# Patient Record
Sex: Female | Born: 1965 | Race: Black or African American | Hispanic: No | Marital: Married | Smoking: Never smoker
Health system: Southern US, Community
[De-identification: ages and names within clinical notes are randomized; demographics above are authoritative.]

## PROBLEM LIST (undated history)

## (undated) DIAGNOSIS — J4530 Mild persistent asthma, uncomplicated: Secondary | ICD-10-CM

## (undated) DIAGNOSIS — R0789 Other chest pain: Secondary | ICD-10-CM

## (undated) DIAGNOSIS — F419 Anxiety disorder, unspecified: Secondary | ICD-10-CM

## (undated) DIAGNOSIS — E079 Disorder of thyroid, unspecified: Secondary | ICD-10-CM

## (undated) DIAGNOSIS — E119 Type 2 diabetes mellitus without complications: Secondary | ICD-10-CM

## (undated) DIAGNOSIS — I10 Essential (primary) hypertension: Secondary | ICD-10-CM

## (undated) DIAGNOSIS — R079 Chest pain, unspecified: Secondary | ICD-10-CM

## (undated) HISTORY — PX: BREAST BIOPSY: SHX20

## (undated) HISTORY — PX: ABDOMINAL HYSTERECTOMY: SHX81

## (undated) HISTORY — PX: DILATION AND CURETTAGE OF UTERUS: SHX78

---

## 1998-04-14 ENCOUNTER — Other Ambulatory Visit: Admission: RE | Admit: 1998-04-14 | Discharge: 1998-04-14 | Payer: Self-pay | Admitting: Obstetrics and Gynecology

## 2000-10-02 ENCOUNTER — Ambulatory Visit (HOSPITAL_COMMUNITY): Admission: RE | Admit: 2000-10-02 | Discharge: 2000-10-02 | Payer: Self-pay | Admitting: Family Medicine

## 2004-03-15 ENCOUNTER — Ambulatory Visit: Payer: Self-pay | Admitting: Family Medicine

## 2004-04-08 ENCOUNTER — Ambulatory Visit: Payer: Self-pay | Admitting: Internal Medicine

## 2004-06-24 ENCOUNTER — Ambulatory Visit: Payer: Self-pay | Admitting: Family Medicine

## 2004-07-17 ENCOUNTER — Emergency Department: Payer: Self-pay | Admitting: Emergency Medicine

## 2004-08-19 ENCOUNTER — Ambulatory Visit: Payer: Self-pay | Admitting: Internal Medicine

## 2005-03-23 ENCOUNTER — Ambulatory Visit: Payer: Self-pay | Admitting: Internal Medicine

## 2005-03-25 ENCOUNTER — Ambulatory Visit: Payer: Self-pay | Admitting: Internal Medicine

## 2005-04-06 ENCOUNTER — Ambulatory Visit: Payer: Self-pay | Admitting: Hospitalist

## 2005-08-31 ENCOUNTER — Ambulatory Visit: Payer: Self-pay | Admitting: Internal Medicine

## 2015-12-31 ENCOUNTER — Emergency Department
Admission: EM | Admit: 2015-12-31 | Discharge: 2015-12-31 | Disposition: A | Discharge status: Home / Self Care | Payer: Self-pay | Attending: Emergency Medicine | Admitting: Emergency Medicine

## 2015-12-31 ENCOUNTER — Encounter: Payer: Self-pay | Admitting: Emergency Medicine

## 2015-12-31 DIAGNOSIS — Z79899 Other long term (current) drug therapy: Secondary | ICD-10-CM | POA: Insufficient documentation

## 2015-12-31 DIAGNOSIS — N390 Urinary tract infection, site not specified: Secondary | ICD-10-CM | POA: Insufficient documentation

## 2015-12-31 DIAGNOSIS — I10 Essential (primary) hypertension: Secondary | ICD-10-CM | POA: Insufficient documentation

## 2015-12-31 DIAGNOSIS — Z7982 Long term (current) use of aspirin: Secondary | ICD-10-CM | POA: Insufficient documentation

## 2015-12-31 DIAGNOSIS — E119 Type 2 diabetes mellitus without complications: Secondary | ICD-10-CM | POA: Insufficient documentation

## 2015-12-31 DIAGNOSIS — Z794 Long term (current) use of insulin: Secondary | ICD-10-CM | POA: Insufficient documentation

## 2015-12-31 DIAGNOSIS — Z7984 Long term (current) use of oral hypoglycemic drugs: Secondary | ICD-10-CM | POA: Insufficient documentation

## 2015-12-31 HISTORY — DX: Disorder of thyroid, unspecified: E07.9

## 2015-12-31 HISTORY — DX: Type 2 diabetes mellitus without complications: E11.9

## 2015-12-31 HISTORY — DX: Essential (primary) hypertension: I10

## 2015-12-31 LAB — GLUCOSE, CAPILLARY: Glucose-Capillary: 155 mg/dL — ABNORMAL HIGH (ref 65–99)

## 2015-12-31 LAB — URINALYSIS COMPLETE WITH MICROSCOPIC (ARMC ONLY)
Bilirubin Urine: NEGATIVE
Glucose, UA: NEGATIVE mg/dL
Ketones, ur: NEGATIVE mg/dL
Nitrite: POSITIVE — AB
Protein, ur: NEGATIVE mg/dL
Specific Gravity, Urine: 1.01 (ref 1.005–1.030)
pH: 6 (ref 5.0–8.0)

## 2015-12-31 MED ORDER — CEPHALEXIN 500 MG PO CAPS
500.0000 mg | ORAL_CAPSULE | Freq: Once | ORAL | Status: AC
  Administered 2015-12-31: 500 mg via ORAL

## 2015-12-31 MED ORDER — CEPHALEXIN 500 MG PO CAPS: 500.0000 mg | ORAL_CAPSULE | Freq: Three times a day (TID) | ORAL | Status: AC

## 2015-12-31 MED ORDER — CEPHALEXIN 500 MG PO CAPS
ORAL_CAPSULE | ORAL | Status: AC
  Administered 2015-12-31: 500 mg via ORAL
  Filled 2015-12-31: qty 1

## 2015-12-31 NOTE — Discharge Instructions (Signed)

## 2015-12-31 NOTE — ED Notes (Signed)
Patient presents to the ED with burning on urination, lower back pain and headache.  Patient reports drinking a lot of water today.  Patient states burning has been getting worse throughout today.  Patient is in no obvious distress at this time.

## 2015-12-31 NOTE — ED Provider Notes (Signed)
[facility] Emergency Department Provider Note   ____________________________________________  Time seen: Approximately 555 PM  I have reviewed the triage vital signs and the nursing notes.   HISTORY  Chief Complaint Dysuria; Back Pain; and Headache    HPI Angela Booth is a 50 y.o. female with a history of diabetes and hypertension who is presenting to the emergency department today with one half days of burning with urination. She said that she was having cramping back pain previously but this has abated. She also has a headache which generalized moderate which started today. She says that she has had a urinary tract infection the past which felt similar.  does not report any fever at home.   Past Medical History  Diagnosis Date  . Hypertension   . Diabetes mellitus without complication (HCC)   . Thyroid disease     There are no active problems to display for this patient.   Past Surgical History  Procedure Laterality Date  . Cesarean section      x2  . Abdominal hysterectomy    . Dilation and curettage of uterus      Current Outpatient Rx  Name  Route  Sig  Dispense  Refill  . amLODipine (NORVASC) 5 MG tablet   Oral   Take 5 mg by mouth daily.         Marland Kitchen. aspirin 81 MG tablet   Oral   Take 81 mg by mouth daily.         . enalapril (VASOTEC) 10 MG tablet   Oral   Take 20 mg by mouth daily.         . insulin detemir (LEVEMIR) 100 UNIT/ML injection   Subcutaneous   Inject 46 Units into the skin at bedtime.         . metFORMIN (GLUCOPHAGE) 500 MG tablet   Oral   Take by mouth daily with breakfast.         . thyroid (ARMOUR) 90 MG tablet   Oral   Take 90 mg by mouth daily.           Allergies Hydrocodone  No family history on file.  Social History Social History  Substance Use Topics  . Smoking status: Never Smoker   . Smokeless tobacco: None  . Alcohol Use: No    Review of Systems Constitutional: No  fever/chills Eyes: No visual changes. ENT: No sore throat. Cardiovascular: Denies chest pain. Respiratory: Denies shortness of breath. Gastrointestinal: No abdominal pain.  No nausea, no vomiting.  No diarrhea.  No constipation. Genitourinary: As above Musculoskeletal: As above Skin: Negative for rash. Neurological: Negative for headaches, focal weakness or numbness.  10-point ROS otherwise negative.  ____________________________________________   PHYSICAL EXAM:  VITAL SIGNS: ED Triage Vitals  Enc Vitals Group     BP 12/31/15 1616 157/90 mmHg     Pulse Rate 12/31/15 1616 72     Resp 12/31/15 1616 18     Temp 12/31/15 1616 98.2 F (36.8 C)     Temp Source 12/31/15 1616 Oral     SpO2 12/31/15 1616 97 %     Weight 12/31/15 1616 217 lb (98.431 kg)     Height 12/31/15 1616 5\' 2"  (1.575 m)     Head Cir --      Peak Flow --      Pain Score 12/31/15 1617 8     Pain Loc --      Pain Edu? --  Excl. in GC? --     Constitutional: Alert and oriented. Well appearing and in no acute distress. Eyes: Conjunctivae are normal. PERRL. EOMI. Head: Atraumatic. Nose: No congestion/rhinnorhea. Mouth/Throat: Mucous membranes are moist.  Neck: No stridor.   Cardiovascular: Normal rate, regular rhythm. Grossly normal heart sounds.   Respiratory: Normal respiratory effort.  No retractions. Lungs CTAB. Gastrointestinal: Soft and nontender. No distention. No CVA tenderness. Musculoskeletal: No lower extremity tenderness nor edema.  No joint effusions. Neurologic:  Normal speech and language. No gross focal neurologic deficits are appreciated.  Skin:  Skin is warm, dry and intact. No rash noted. Psychiatric: Mood and affect are normal. Speech and behavior are normal.  ____________________________________________   LABS (all labs ordered are listed, but only abnormal results are displayed)  Labs Reviewed  URINALYSIS COMPLETEWITH MICROSCOPIC ([facility] ONLY) - Abnormal; Notable for the  following:    Color, Urine AMBER (*)    APPearance CLEAR (*)    Hgb urine dipstick 2+ (*)    Nitrite POSITIVE (*)    Leukocytes, UA 1+ (*)    Bacteria, UA RARE (*)    Squamous Epithelial / LPF 0-5 (*)    All other components within normal limits  GLUCOSE, CAPILLARY - Abnormal; Notable for the following:    Glucose-Capillary 155 (*)    All other components within normal limits  URINE CULTURE  CBG MONITORING, ED   ____________________________________________  EKG   ____________________________________________  RADIOLOGY   ____________________________________________   PROCEDURES   Procedures  ____________________________________________   INITIAL IMPRESSION / ASSESSMENT AND PLAN / ED COURSE  Pertinent labs & imaging results that were available during my care of the patient were reviewed by me and considered in my medical decision making (see chart for details).  ----------------------------------------- 6:12 PM on 12/31/2015 -----------------------------------------  Patient with very reassuring exam. Denies any history of kidney stones. Has blood in her urine but this is mostly related to urinary tract infection. We'll discharge with Keflex. Also with well-controlled blood sugar which today was 155. Patient understands the plan as well as for follow-up to her primary care doctor. Will be discharged home. ____________________________________________   FINAL CLINICAL IMPRESSION(S) / ED DIAGNOSES  UTI.    NEW MEDICATIONS STARTED DURING THIS VISIT:  New Prescriptions   No medications on file     Note:  This document was prepared using Dragon voice recognition software and may include unintentional dictation errors.    Myrna Blazeravid Matthew Schaevitz, MD 12/31/15 [phone]

## 2016-01-03 LAB — URINE CULTURE

## 2016-01-20 ENCOUNTER — Ambulatory Visit: Payer: Self-pay

## 2016-02-17 ENCOUNTER — Ambulatory Visit: Payer: Self-pay

## 2016-03-02 ENCOUNTER — Encounter: Payer: Self-pay | Admitting: *Deleted

## 2016-03-02 ENCOUNTER — Ambulatory Visit
Admission: RE | Admit: 2016-03-02 | Discharge: 2016-03-02 | Disposition: A | Discharge status: Home / Self Care | Payer: Self-pay | Source: Ambulatory Visit | Attending: Oncology | Admitting: Oncology

## 2016-03-02 ENCOUNTER — Ambulatory Visit: Payer: Self-pay | Attending: Oncology | Admitting: *Deleted

## 2016-03-02 ENCOUNTER — Encounter (INDEPENDENT_AMBULATORY_CARE_PROVIDER_SITE_OTHER): Payer: Self-pay

## 2016-03-02 VITALS — BP 140/88 | HR 64 | Temp 97.9°F | Ht 63.78 in | Wt 213.6 lb

## 2016-03-02 DIAGNOSIS — Z0000 Encounter for general adult medical examination without abnormal findings: Secondary | ICD-10-CM

## 2016-03-02 NOTE — Patient Instructions (Signed)
Gave patient hand-out, Women Staying Healthy, Active and Well from [facility], with education on breast health, pap smears, heart and colon health. 

## 2016-03-02 NOTE — Progress Notes (Signed)
Subjective:     Patient ID: Angela Booth, female   DOB: 03/01/1966, 50 y.o.   MRN: [id number]  HPI   Review of Systems     Objective:   Physical Exam  Pulmonary/Chest: Right breast exhibits no inverted nipple, no mass, no nipple discharge, no skin change and no tenderness. Left breast exhibits no inverted nipple, no mass, no nipple discharge, no skin change and no tenderness. Breasts are symmetrical.  Abdominal: There is no splenomegaly or hepatomegaly.  Genitourinary: No labial fusion. There is no rash, tenderness, lesion or injury on the right labia. There is no rash, tenderness, lesion or injury on the left labia. Right adnexum displays no mass, no tenderness and no fullness. Left adnexum displays no mass, no tenderness and no fullness. No erythema, tenderness or bleeding in the vagina. No foreign body in the vagina. No signs of injury around the vagina. No vaginal discharge found.  Genitourinary Comments: History of hysterectomy for heavy bleeding       Assessment:     50 year old Black [facility].  Clinical breast exam unremarkable.  Taught self breast awareness.  Pelvic exam without masses or lesions.  No cervix identified which is consistent with a hysterectomy.  Patient has been screened for eligibility.  She does not have any insurance, Medicare or Medicaid.  She also meets financial eligibility.  Hand-out given on the Affordable Care Act.    Plan:     Bilateral screening mammogram ordered.  Will follow-up per protocol.

## 2016-03-07 ENCOUNTER — Other Ambulatory Visit: Payer: Self-pay | Admitting: *Deleted

## 2016-03-07 DIAGNOSIS — R920 Mammographic microcalcification found on diagnostic imaging of breast: Secondary | ICD-10-CM

## 2016-03-07 DIAGNOSIS — N63 Unspecified lump in unspecified breast: Secondary | ICD-10-CM

## 2016-03-22 ENCOUNTER — Ambulatory Visit
Admission: RE | Admit: 2016-03-22 | Discharge: 2016-03-22 | Disposition: A | Discharge status: Home / Self Care | Payer: Self-pay | Source: Ambulatory Visit | Attending: Oncology | Admitting: Oncology

## 2016-03-22 DIAGNOSIS — R920 Mammographic microcalcification found on diagnostic imaging of breast: Secondary | ICD-10-CM

## 2016-03-22 DIAGNOSIS — N63 Unspecified lump in unspecified breast: Secondary | ICD-10-CM

## 2016-03-23 ENCOUNTER — Other Ambulatory Visit: Payer: Self-pay | Admitting: *Deleted

## 2016-03-23 ENCOUNTER — Encounter: Payer: Self-pay | Admitting: *Deleted

## 2016-03-23 DIAGNOSIS — R920 Mammographic microcalcification found on diagnostic imaging of breast: Secondary | ICD-10-CM

## 2016-03-24 ENCOUNTER — Encounter: Payer: Self-pay | Admitting: *Deleted

## 2016-03-24 NOTE — Progress Notes (Signed)
Letter mailed to inform patient of her next mammogram in 6 months on 09/20/16 @ 10:00.  HSIS to [place].

## 2016-09-20 ENCOUNTER — Ambulatory Visit
Admission: RE | Admit: 2016-09-20 | Discharge: 2016-09-20 | Disposition: A | Discharge status: Home / Self Care | Payer: Self-pay | Source: Ambulatory Visit | Attending: Oncology | Admitting: Oncology

## 2016-09-20 DIAGNOSIS — R920 Mammographic microcalcification found on diagnostic imaging of breast: Secondary | ICD-10-CM

## 2016-09-21 ENCOUNTER — Encounter: Payer: Self-pay | Admitting: *Deleted

## 2016-09-21 NOTE — Progress Notes (Unsigned)
Letter mailed with the patients mammogram results and need to follow-up in 6 months.  Next appointment scheduled for 03/20/17 @ 8:00.  HSIS to [place].

## 2017-01-06 ENCOUNTER — Encounter: Payer: Self-pay | Admitting: Emergency Medicine

## 2017-01-06 ENCOUNTER — Emergency Department
Admission: EM | Admit: 2017-01-06 | Discharge: 2017-01-06 | Disposition: A | Discharge status: Home / Self Care | Payer: Self-pay | Attending: Emergency Medicine | Admitting: Emergency Medicine

## 2017-01-06 DIAGNOSIS — E119 Type 2 diabetes mellitus without complications: Secondary | ICD-10-CM | POA: Insufficient documentation

## 2017-01-06 DIAGNOSIS — I10 Essential (primary) hypertension: Secondary | ICD-10-CM | POA: Insufficient documentation

## 2017-01-06 DIAGNOSIS — R350 Frequency of micturition: Secondary | ICD-10-CM | POA: Insufficient documentation

## 2017-01-06 DIAGNOSIS — N3001 Acute cystitis with hematuria: Secondary | ICD-10-CM | POA: Insufficient documentation

## 2017-01-06 DIAGNOSIS — Z7984 Long term (current) use of oral hypoglycemic drugs: Secondary | ICD-10-CM | POA: Insufficient documentation

## 2017-01-06 LAB — URINALYSIS, COMPLETE (UACMP) WITH MICROSCOPIC
Bilirubin Urine: NEGATIVE
Glucose, UA: NEGATIVE mg/dL
KETONES UR: NEGATIVE mg/dL
Leukocytes, UA: NEGATIVE
Nitrite: NEGATIVE
PROTEIN: NEGATIVE mg/dL
Specific Gravity, Urine: 1.013 (ref 1.005–1.030)
pH: 5 (ref 5.0–8.0)

## 2017-01-06 MED ORDER — PHENAZOPYRIDINE HCL 100 MG PO TABS: 100.0000 mg | ORAL_TABLET | Freq: Three times a day (TID) | ORAL | 0 refills | Status: AC | PRN

## 2017-01-06 MED ORDER — CEPHALEXIN 500 MG PO CAPS: 500.0000 mg | ORAL_CAPSULE | Freq: Two times a day (BID) | ORAL | 0 refills | Status: AC

## 2017-01-06 NOTE — ED Provider Notes (Signed)
[facility] Emergency Department Provider Note  ____________________________________________  Time seen: Approximately 5:07 PM  I have reviewed the triage vital signs and the nursing notes.   HISTORY  Chief Complaint Urinary Frequency    HPI Angela Booth is a 51 y.o. female that presents to the emergency department with 1 day of dysuria and frequency of urination. She states that her last urinary tract infection was 6 months ago and this feels the same. No alleviating measures have been attempted. She denies fever, shortness breath, chest pain, nausea, vomiting, abdominal pain, back pain, hematuria, vaginal discharge.   Past Medical History:  Diagnosis Date  . Diabetes mellitus without complication (HCC)   . Hypertension   . Thyroid disease     There are no active problems to display for this patient.   Past Surgical History:  Procedure Laterality Date  . ABDOMINAL HYSTERECTOMY    . CESAREAN SECTION     x2  . DILATION AND CURETTAGE OF UTERUS      Prior to Admission medications   Medication Sig Start Date End Date Taking? Authorizing Provider  amLODipine (NORVASC) 5 MG tablet Take 5 mg by mouth daily.    [provider]  aspirin 81 MG tablet Take 81 mg by mouth daily.    [provider]  cephALEXin (KEFLEX) 500 MG capsule Take 1 capsule (500 mg total) by mouth 2 (two) times daily. 01/06/17 01/16/17  Enid Derry, PA-C  enalapril (VASOTEC) 10 MG tablet Take 20 mg by mouth daily.    [provider]  insulin detemir (LEVEMIR) 100 UNIT/ML injection Inject 46 Units into the skin at bedtime.    [provider]  metFORMIN (GLUCOPHAGE) 500 MG tablet Take by mouth daily with breakfast.    [provider]  phenazopyridine (PYRIDIUM) 100 MG tablet Take 1 tablet (100 mg total) by mouth 3 (three) times daily as needed for pain. 01/06/17 01/06/18  Enid Derry, PA-C  thyroid (ARMOUR) 90 MG tablet Take 90 mg by mouth  daily.    [provider]    Allergies Hydrocodone  Family History  Problem Relation Age of Onset  . Family history unknown: Yes    Social History Social History  Substance Use Topics  . Smoking status: Never Smoker  . Smokeless tobacco: Never Used  . Alcohol use No     Review of Systems  Constitutional: No fever/chills Cardiovascular: No chest pain. Respiratory: No SOB. Gastrointestinal: No abdominal pain.  No nausea, no vomiting.  Musculoskeletal: Negative for musculoskeletal pain. Skin: Negative for rash, abrasions, lacerations, ecchymosis. Neurological: Negative for headaches   ____________________________________________   PHYSICAL EXAM:  VITAL SIGNS: ED Triage Vitals  Enc Vitals Group     BP 01/06/17 1419 (!) 132/93     Pulse Rate 01/06/17 1419 79     Resp 01/06/17 1419 20     Temp 01/06/17 1419 97.9 F (36.6 C)     Temp Source 01/06/17 1419 Oral     SpO2 01/06/17 1419 100 %     Weight 01/06/17 1402 213 lb (96.6 kg)     Height --      Head Circumference --      Peak Flow --      Pain Score 01/06/17 1401 4     Pain Loc --      Pain Edu? --      Excl. in GC? --      Constitutional: Alert and oriented. Well appearing and in no acute  distress. Eyes: Conjunctivae are normal. PERRL. EOMI. Head: Atraumatic. ENT:      Ears:      Nose: No congestion/rhinnorhea.      Mouth/Throat: Mucous membranes are moist.  Neck: No stridor.   Cardiovascular: Normal rate, regular rhythm.  Good peripheral circulation. Respiratory: Normal respiratory effort without tachypnea or retractions. Lungs CTAB. Good air entry to the bases with no decreased or absent breath sounds. Gastrointestinal: Bowel sounds 4 quadrants. Soft and nontender to palpation. No guarding or rigidity. No palpable masses. No distention. No CVA tenderness. Musculoskeletal: Full range of motion to all extremities. No gross deformities appreciated. Neurologic:  Normal speech and language. No  gross focal neurologic deficits are appreciated.  Skin:  Skin is warm, dry and intact. No rash noted. Psychiatric: Mood and affect are normal. Speech and behavior are normal. Patient exhibits appropriate insight and judgement.   ____________________________________________   LABS (all labs ordered are listed, but only abnormal results are displayed)  Labs Reviewed  URINALYSIS, COMPLETE (UACMP) WITH MICROSCOPIC - Abnormal; Notable for the following:       Result Value   Color, Urine YELLOW (*)    APPearance CLEAR (*)    Hgb urine dipstick SMALL (*)    Bacteria, UA RARE (*)    Squamous Epithelial / LPF 0-5 (*)    All other components within normal limits  URINE CULTURE   ____________________________________________  EKG   ____________________________________________  RADIOLOGY  No results found.  ____________________________________________    PROCEDURES  Procedure(s) performed:    Procedures    Medications - No data to display   ____________________________________________   INITIAL IMPRESSION / ASSESSMENT AND PLAN / ED COURSE  Pertinent labs & imaging results that were available during my care of the patient were reviewed by me and considered in my medical decision making (see chart for details).  Review of the [place] CSRS was performed in accordance of the [facility] prior to dispensing any controlled drugs.     Patient's diagnosis is consistent with Urinary tract infection. Vital signs and exam are reassuring. Symptoms consistent with urinary tract infection. We are bacteria seen on urinalysis and this will be sent for culture. Patient will be discharged home with prescriptions for Keflex and Pyridium. Patient is to follow up with PCP as directed. Patient is given ED precautions to return to the ED for any worsening or new symptoms.     ____________________________________________  FINAL CLINICAL IMPRESSION(S) / ED DIAGNOSES  Final diagnoses:  Acute  cystitis with hematuria      NEW MEDICATIONS STARTED DURING THIS VISIT:  Discharge Medication List as of 01/06/2017  3:40 PM    START taking these medications   Details  cephALEXin (KEFLEX) 500 MG capsule Take 1 capsule (500 mg total) by mouth 2 (two) times daily., Starting Fri 01/06/2017, Until Mon 01/16/2017, Print    phenazopyridine (PYRIDIUM) 100 MG tablet Take 1 tablet (100 mg total) by mouth 3 (three) times daily as needed for pain., Starting Fri 01/06/2017, Until Sat 01/06/2018, Print            This chart was dictated using voice recognition software/Dragon. Despite best efforts to proofread, errors can occur which can change the meaning. Any change was purely unintentional.    Enid DerryWagner, Mortimer Bair, PA-C 01/06/17 2339    Nita SickleVeronese, [place], MD 01/10/17 [phone]

## 2017-01-06 NOTE — ED Triage Notes (Signed)
Pt to ed with c/o urinary frequency and burning that started last night.  

## 2017-01-08 LAB — URINE CULTURE: Culture: 40000 — AB

## 2017-03-20 ENCOUNTER — Ambulatory Visit
Admission: RE | Admit: 2017-03-20 | Discharge: 2017-03-20 | Disposition: A | Discharge status: Home / Self Care | Payer: Self-pay | Source: Ambulatory Visit | Attending: Oncology | Admitting: Oncology

## 2017-03-20 ENCOUNTER — Ambulatory Visit: Payer: Self-pay | Attending: Oncology | Admitting: *Deleted

## 2017-03-20 ENCOUNTER — Encounter: Payer: Self-pay | Admitting: *Deleted

## 2017-03-20 VITALS — BP 122/82 | Temp 98.5°F | Ht 64.0 in | Wt 224.0 lb

## 2017-03-20 DIAGNOSIS — R920 Mammographic microcalcification found on diagnostic imaging of breast: Secondary | ICD-10-CM

## 2017-03-20 NOTE — Progress Notes (Signed)
Subjective:     Patient ID: Angela Booth, female   DOB: Jul 04, 1965, 51 y.o.   MRN: [id number]  HPI   Review of Systems     Objective:   Physical Exam  Pulmonary/Chest: Right breast exhibits no inverted nipple, no mass, no nipple discharge, no skin change and no tenderness. Left breast exhibits no inverted nipple, no mass, no nipple discharge, no skin change and no tenderness. Breasts are symmetrical.    Transverse slit in bilateral nipples       Assessment:     51 year old Black female returns to [facility] for annual screening.  Last mammogram on 09/20/16 was a birads 3.  Clinical breast exam unremarkable.  Taught self breast awareness.  Patient with a history of hysterectomy for heavy bleeding.  Explained we will do a pelvic exam next year since she still has her ovaries.  She is agreeable.  Patient has been screened for eligibility.  She does not have any insurance, Medicare or Medicaid.  She also meets financial eligibility.  Hand-out given on the Affordable Care Act.    Plan:     Bilateral diagnostic mammogram and ultrasound ordered for 6 month f/u of birads 3 mammogram.  Will follow-up per [facility] protocol.

## 2017-03-20 NOTE — Patient Instructions (Signed)
Gave patient hand-out, Women Staying Healthy, Active and Well from [facility], with education on breast health, pap smears, heart and colon health. 

## 2017-03-21 ENCOUNTER — Other Ambulatory Visit: Payer: Self-pay | Admitting: *Deleted

## 2017-03-21 DIAGNOSIS — R920 Mammographic microcalcification found on diagnostic imaging of breast: Secondary | ICD-10-CM

## 2017-03-29 ENCOUNTER — Ambulatory Visit
Admission: RE | Admit: 2017-03-29 | Discharge: 2017-03-29 | Disposition: A | Discharge status: Home / Self Care | Payer: Self-pay | Source: Ambulatory Visit | Attending: Oncology | Admitting: Oncology

## 2017-03-29 DIAGNOSIS — R920 Mammographic microcalcification found on diagnostic imaging of breast: Secondary | ICD-10-CM

## 2017-03-29 HISTORY — PX: BREAST BIOPSY: SHX20

## 2017-03-30 ENCOUNTER — Encounter: Payer: Self-pay | Admitting: *Deleted

## 2017-03-30 LAB — SURGICAL PATHOLOGY

## 2017-03-30 NOTE — Progress Notes (Signed)
Letter mailed to inform patient of her next diagnostic mammogram on 03/21/18 @ 8:00.  HSIS to [place].

## 2017-11-02 ENCOUNTER — Other Ambulatory Visit: Payer: Self-pay

## 2017-11-02 ENCOUNTER — Emergency Department: Payer: Self-pay

## 2017-11-02 ENCOUNTER — Emergency Department
Admission: EM | Admit: 2017-11-02 | Discharge: 2017-11-02 | Disposition: A | Discharge status: Home / Self Care | Payer: Self-pay | Attending: Emergency Medicine | Admitting: Emergency Medicine

## 2017-11-02 ENCOUNTER — Encounter: Payer: Self-pay | Admitting: Emergency Medicine

## 2017-11-02 DIAGNOSIS — E119 Type 2 diabetes mellitus without complications: Secondary | ICD-10-CM | POA: Insufficient documentation

## 2017-11-02 DIAGNOSIS — I10 Essential (primary) hypertension: Secondary | ICD-10-CM | POA: Insufficient documentation

## 2017-11-02 DIAGNOSIS — Z79899 Other long term (current) drug therapy: Secondary | ICD-10-CM | POA: Insufficient documentation

## 2017-11-02 DIAGNOSIS — J45909 Unspecified asthma, uncomplicated: Secondary | ICD-10-CM

## 2017-11-02 DIAGNOSIS — Z794 Long term (current) use of insulin: Secondary | ICD-10-CM | POA: Insufficient documentation

## 2017-11-02 DIAGNOSIS — J45901 Unspecified asthma with (acute) exacerbation: Secondary | ICD-10-CM | POA: Insufficient documentation

## 2017-11-02 LAB — BASIC METABOLIC PANEL
Anion gap: 9 (ref 5–15)
BUN: 12 mg/dL (ref 6–20)
CALCIUM: 9.5 mg/dL (ref 8.9–10.3)
CO2: 27 mmol/L (ref 22–32)
CREATININE: 0.62 mg/dL (ref 0.44–1.00)
Chloride: 100 mmol/L — ABNORMAL LOW (ref 101–111)
GFR calc Af Amer: >60 mL/min (ref >60)
Glucose, Bld: 154 mg/dL — ABNORMAL HIGH (ref 65–99)
Potassium: 3.7 mmol/L (ref 3.5–5.1)
Sodium: 136 mmol/L (ref 135–145)

## 2017-11-02 LAB — CBC WITH DIFFERENTIAL/PLATELET
BASOS ABS: 0 10*3/uL (ref 0–0.1)
Basophils Relative: 1 %
EOS PCT: 3 %
Eosinophils Absolute: 0.2 10*3/uL (ref 0–0.7)
HCT: 34.9 % — ABNORMAL LOW (ref 35.0–47.0)
Hemoglobin: 12 g/dL (ref 12.0–16.0)
Lymphocytes Relative: 40 %
Lymphs Abs: 2.4 10*3/uL (ref 1.0–3.6)
MCH: 30.1 pg (ref 26.0–34.0)
MCHC: 34.3 g/dL (ref 32.0–36.0)
MCV: 87.9 fL (ref 80.0–100.0)
MONO ABS: 0.3 10*3/uL (ref 0.2–0.9)
Monocytes Relative: 6 %
NEUTROS ABS: 3 10*3/uL (ref 1.4–6.5)
NEUTROS PCT: 50 %
PLATELETS: 307 10*3/uL (ref 150–440)
RBC: 3.97 MIL/uL (ref 3.80–5.20)
RDW: 15.2 % — AB (ref 11.5–14.5)
WBC: 5.9 10*3/uL (ref 3.6–11.0)

## 2017-11-02 LAB — TROPONIN I: Troponin I: <0.03 ng/mL (ref <0.03)

## 2017-11-02 MED ORDER — IPRATROPIUM-ALBUTEROL 0.5-2.5 (3) MG/3ML IN SOLN: 3.0000 mL | Freq: Four times a day (QID) | RESPIRATORY_TRACT | 6 refills | Status: DC | PRN

## 2017-11-02 MED ORDER — OMEPRAZOLE 40 MG PO CPDR: 40.0000 mg | DELAYED_RELEASE_CAPSULE | Freq: Every day | ORAL | 1 refills | Status: DC

## 2017-11-02 MED ORDER — MONTELUKAST SODIUM 10 MG PO TABS
10.0000 mg | ORAL_TABLET | Freq: Every day | ORAL | 3 refills | Status: DC
Start: 2017-11-02 — End: 2018-10-19

## 2017-11-02 MED ORDER — IPRATROPIUM-ALBUTEROL 0.5-2.5 (3) MG/3ML IN SOLN
3.0000 mL | Freq: Once | RESPIRATORY_TRACT | Status: AC
  Administered 2017-11-02: 3 mL via RESPIRATORY_TRACT
  Filled 2017-11-02: qty 3

## 2017-11-02 NOTE — ED Notes (Signed)
See triage note  States she has had cough for several months  Has been seen by PCP  But has not had x-ray   No fever

## 2017-11-02 NOTE — Discharge Instructions (Signed)
Follow-up with your regular doctor in 3 to 4 days.  Asked them about testing for mold exposure.  Patient also inquired about seeing a pulmonologist.  If you are worsening please return the emergency department.  He is all medications as prescribed.

## 2017-11-02 NOTE — ED Provider Notes (Signed)
[facility] Emergency Department Provider Note  ____________________________________________   First MD Initiated Contact with Patient 11/02/17 1118     (approximate)  I have reviewed the triage vital signs and the nursing notes.   HISTORY  Chief Complaint Cough and Shortness of Breath    HPI Angela Booth is a 52 y.o. female presents emergency department complaining of a cough for several months.  She was seen by her PCP while back and stated they needed to do an x-ray.  Since they did not have the equipment in the clinic they said to come to the emergency department.  The patient states that they have had mold in the home they were living in.  They have recently moved out.  She states that she has had this cough and feels like she is wheezing in her chest is tight frequently.  She states there is been minimal improvement since leaving the environment with mold.  Past Medical History:  Diagnosis Date  . Diabetes mellitus without complication (HCC)   . Hypertension   . Thyroid disease     There are no active problems to display for this patient.   Past Surgical History:  Procedure Laterality Date  . ABDOMINAL HYSTERECTOMY    . BREAST BIOPSY Right 03/29/2017   stereo bx path pending  . CESAREAN SECTION     x2  . DILATION AND CURETTAGE OF UTERUS      Prior to Admission medications   Medication Sig Start Date End Date Taking? Authorizing Provider  amLODipine (NORVASC) 5 MG tablet Take 5 mg by mouth daily.    [provider]  aspirin 81 MG tablet Take 81 mg by mouth daily.    [provider]  enalapril (VASOTEC) 10 MG tablet Take 20 mg by mouth daily.    [provider]  insulin detemir (LEVEMIR) 100 UNIT/ML injection Inject 46 Units into the skin at bedtime.    [provider]  ipratropium-albuterol (DUONEB) 0.5-2.5 (3) MG/3ML SOLN Take 3 mLs by nebulization every 6 (six) hours as needed. 11/02/17   Nikolaj Geraghty, Roselyn Bering, PA-C  metFORMIN (GLUCOPHAGE) 500 MG tablet Take by mouth daily with breakfast.    [provider]  montelukast (SINGULAIR) 10 MG tablet Take 1 tablet (10 mg total) by mouth at bedtime. 11/02/17   Sanyiah Kanzler, Roselyn Bering, PA-C  omeprazole (PRILOSEC) 40 MG capsule Take 1 capsule (40 mg total) by mouth daily. 11/02/17   Shyenne Maggard, Roselyn Bering, PA-C  phenazopyridine (PYRIDIUM) 100 MG tablet Take 1 tablet (100 mg total) by mouth 3 (three) times daily as needed for pain. 01/06/17 01/06/18  Enid Derry, PA-C  thyroid (ARMOUR) 90 MG tablet Take 90 mg by mouth daily.    [provider]    Allergies Hydrocodone  Family History  Family history unknown: Yes    Social History Social History   Tobacco Use  . Smoking status: Never Smoker  . Smokeless tobacco: Never Used  Substance Use Topics  . Alcohol use: No  . Drug use: No    Review of Systems  Constitutional: No fever/chills Eyes: No visual changes. ENT: No sore throat. Respiratory: Positive cough and wheezing, albuterol is not helping Genitourinary: Negative for dysuria. Musculoskeletal: Negative for back pain. Skin: Negative for rash.    ____________________________________________   PHYSICAL EXAM:  VITAL SIGNS: ED Triage Vitals  Enc Vitals Group     BP 11/02/17 1018 (!) 142/81     Pulse Rate 11/02/17 1018 94  Resp 11/02/17 1018 18     Temp 11/02/17 1018 98.7 F (37.1 C)     Temp Source 11/02/17 1018 Oral     SpO2 11/02/17 1018 100 %     Weight 11/02/17 1016 224 lb (101.6 kg)     Height 11/02/17 1016  (1.575 m)     Head Circumference --      Peak Flow --      Pain Score 11/02/17 1016 0     Pain Loc --      Pain Edu? --      Excl. in GC? --     Constitutional: Alert and oriented. Well appearing and in no acute distress. Eyes: Conjunctivae are normal.  Head: Atraumatic. Nose: No congestion/rhinnorhea. Mouth/Throat: Mucous membranes are moist.   Cardiovascular: Normal rate, regular rhythm.  Heart  sounds are normal Respiratory: Normal respiratory effort.  No retractions, lungs are clear to auscultation, the patient has a dry hacking cough GU: deferred Musculoskeletal: FROM all extremities, warm and well perfused Neurologic:  Normal speech and language.  Skin:  Skin is warm, dry and intact. No rash noted. Psychiatric: Mood and affect are normal. Speech and behavior are normal.  ____________________________________________   LABS (all labs ordered are listed, but only abnormal results are displayed)  Labs Reviewed  CBC WITH DIFFERENTIAL/PLATELET - Abnormal; Notable for the following components:      Result Value   HCT 34.9 (*)    RDW 15.2 (*)    All other components within normal limits  BASIC METABOLIC PANEL - Abnormal; Notable for the following components:   Chloride 100 (*)    Glucose, Bld 154 (*)    All other components within normal limits  TROPONIN I   ____________________________________________   ____________________________________________  RADIOLOGY  Chest x-ray is negative for pneumonia or any other cardiopulmonary abnormality  ____________________________________________   PROCEDURES  Procedure(s) performed: DuoNeb  Procedures    ____________________________________________   INITIAL IMPRESSION / ASSESSMENT AND PLAN / ED COURSE  Pertinent labs & imaging results that were available during my care of the patient were reviewed by me and considered in my medical decision making (see chart for details).  Patient is 52 year old female presents emergency department complaining of a cough for several months.  She recently moved out of the house that was condemned due to mold.  She states there is been minimal improvement since she moved out of this environment.  On physical exam the patient appears well.  She does have a dry hacking cough.  Remainder the exam is benign  Due to the coughing, patient was given a DuoNeb.  After the DuoNeb the patient  states she feels much better.  All labs and x-rays were reviewed with the patient.  She is to follow-up with her regular doctor and discuss the mold exposure.  Advised her that they should request a referral to pulmonology.  She was given a prescription for Singulair 10 mg nightly, DuoNeb Nebules, nebulizer machine, and omeprazole 40 mg nightly.  She was instructed to take these medications as prescribed.  She states she understands and was discharged in stable condition.     As part of my medical decision making, I reviewed the following data within the electronic MEDICAL RECORD NUMBER Nursing notes reviewed and incorporated, Labs reviewed CBC, basic metabolic panel and troponin are all within normal limits, EKG interpreted NSR, EKG was read by the heart station Dr., Old chart reviewed, Radiograph reviewed chest x-ray is negative for any acute  abnormalities, Notes from prior ED visits and [place] Controlled Substance Database  ____________________________________________   FINAL CLINICAL IMPRESSION(S) / ED DIAGNOSES  Final diagnoses:  Acute asthmatic bronchitis      NEW MEDICATIONS STARTED DURING THIS VISIT:  New Prescriptions   IPRATROPIUM-ALBUTEROL (DUONEB) 0.5-2.5 (3) MG/3ML SOLN    Take 3 mLs by nebulization every 6 (six) hours as needed.   MONTELUKAST (SINGULAIR) 10 MG TABLET    Take 1 tablet (10 mg total) by mouth at bedtime.   OMEPRAZOLE (PRILOSEC) 40 MG CAPSULE    Take 1 capsule (40 mg total) by mouth daily.     Note:  This document was prepared using Dragon voice recognition software and may include unintentional dictation errors.    Faythe Ghee, PA-C 11/02/17 1212    Rockne Menghini, MD 11/02/17 [phone]

## 2017-11-02 NOTE — ED Triage Notes (Signed)
Has had cough since end of last year that still persists.  [facility] to [facility] and they are unable to do chest xray.  C/o SHOB and sweating at times that is intermittent for same time frame as cough.  Has tried multiple OTC meds.  unlabored currently, VSS.  Reports did find out 2 months ago had mold in home.

## 2018-03-21 ENCOUNTER — Ambulatory Visit: Payer: Self-pay | Attending: Oncology

## 2018-08-08 ENCOUNTER — Encounter: Payer: Self-pay | Admitting: *Deleted

## 2018-08-08 ENCOUNTER — Ambulatory Visit: Payer: Self-pay | Attending: Oncology | Admitting: *Deleted

## 2018-08-08 ENCOUNTER — Ambulatory Visit
Admission: RE | Admit: 2018-08-08 | Discharge: 2018-08-08 | Disposition: A | Discharge status: Home / Self Care | Payer: Self-pay | Source: Ambulatory Visit | Attending: Oncology | Admitting: Oncology

## 2018-08-08 ENCOUNTER — Encounter (INDEPENDENT_AMBULATORY_CARE_PROVIDER_SITE_OTHER): Payer: Self-pay

## 2018-08-08 VITALS — BP 121/82 | HR 79 | Temp 97.9°F | Ht 64.0 in | Wt 223.8 lb

## 2018-08-08 DIAGNOSIS — N63 Unspecified lump in unspecified breast: Secondary | ICD-10-CM

## 2018-08-08 NOTE — Progress Notes (Signed)
  Subjective:     Patient ID: Angela Booth, female   DOB: [birth year], 53 y.o.   MRN: [id number]  HPI   Review of Systems     Objective:   Physical Exam Chest:     Breasts:        Right: No swelling, bleeding, inverted nipple, mass, nipple discharge, skin change or tenderness.        Left: No swelling, bleeding, inverted nipple, mass, nipple discharge, skin change or tenderness.  Lymphadenopathy:     Upper Body:     Right upper body: No supraclavicular or axillary adenopathy.     Left upper body: No supraclavicular or axillary adenopathy.        Assessment:     53 year old Black female returns to [facility] for annual screening.  Last mammogram 03/20/17 was a birads 4 with benign biopsy of PASH.  Radiologist has recommended annual diagnostic mammogram.  On clinical breast exam there is no dominant mass, skin changes, nipple discharge or lymphadenopathy.  Taught self breast awareness.  Patient has been screened for eligibility.  She does not have any insurance, Medicare or Medicaid.  She also meets financial eligibility.  Hand-out given on the Affordable Care Act. Risk Assessment    Risk Scores      08/08/2018   Last edited by: Scarlett Presto, RN   5-year risk: 1.2 %   Lifetime risk: 8.3 %            Plan:     Bilateral diagnostic mammogram and ultrasound ordered.  Will follow-up per [facility] protocol.

## 2018-08-08 NOTE — Patient Instructions (Signed)
Gave patient hand-out, Women Staying Healthy, Active and Well from [facility], with education on breast health, pap smears, heart and colon health. 

## 2018-08-09 ENCOUNTER — Encounter: Payer: Self-pay | Admitting: *Deleted

## 2018-08-09 NOTE — Progress Notes (Signed)
Letter mailed from the [facility] to inform patient of her normal mammogram results.  Patient is to follow-up with annual screening in one year.  HSIS to Christy. 

## 2018-10-02 ENCOUNTER — Emergency Department (HOSPITAL_COMMUNITY): Payer: No Typology Code available for payment source

## 2018-10-02 ENCOUNTER — Encounter (HOSPITAL_COMMUNITY): Payer: Self-pay | Admitting: Emergency Medicine

## 2018-10-02 ENCOUNTER — Inpatient Hospital Stay (HOSPITAL_COMMUNITY)
Admission: EM | Admit: 2018-10-02 | Discharge: 2018-10-05 | DRG: 488 | Disposition: A | Discharge status: Rehabilitation Facility | Payer: No Typology Code available for payment source | Attending: Student | Admitting: Student

## 2018-10-02 DIAGNOSIS — T07XXXA Unspecified multiple injuries, initial encounter: Secondary | ICD-10-CM

## 2018-10-02 DIAGNOSIS — S42352A Displaced comminuted fracture of shaft of humerus, left arm, initial encounter for closed fracture: Secondary | ICD-10-CM | POA: Diagnosis present

## 2018-10-02 DIAGNOSIS — Z90710 Acquired absence of both cervix and uterus: Secondary | ICD-10-CM

## 2018-10-02 DIAGNOSIS — E785 Hyperlipidemia, unspecified: Secondary | ICD-10-CM | POA: Diagnosis present

## 2018-10-02 DIAGNOSIS — E46 Unspecified protein-calorie malnutrition: Secondary | ICD-10-CM | POA: Diagnosis present

## 2018-10-02 DIAGNOSIS — D62 Acute posthemorrhagic anemia: Secondary | ICD-10-CM | POA: Diagnosis present

## 2018-10-02 DIAGNOSIS — Z885 Allergy status to narcotic agent status: Secondary | ICD-10-CM

## 2018-10-02 DIAGNOSIS — S82122A Displaced fracture of lateral condyle of left tibia, initial encounter for closed fracture: Secondary | ICD-10-CM | POA: Diagnosis present

## 2018-10-02 DIAGNOSIS — Z7982 Long term (current) use of aspirin: Secondary | ICD-10-CM | POA: Diagnosis not present

## 2018-10-02 DIAGNOSIS — F419 Anxiety disorder, unspecified: Secondary | ICD-10-CM | POA: Diagnosis present

## 2018-10-02 DIAGNOSIS — R000 Tachycardia, unspecified: Secondary | ICD-10-CM | POA: Diagnosis not present

## 2018-10-02 DIAGNOSIS — Z794 Long term (current) use of insulin: Secondary | ICD-10-CM

## 2018-10-02 DIAGNOSIS — Z79899 Other long term (current) drug therapy: Secondary | ICD-10-CM | POA: Diagnosis not present

## 2018-10-02 DIAGNOSIS — G8918 Other acute postprocedural pain: Secondary | ICD-10-CM

## 2018-10-02 DIAGNOSIS — S80212A Abrasion, left knee, initial encounter: Secondary | ICD-10-CM | POA: Diagnosis present

## 2018-10-02 DIAGNOSIS — Y9301 Activity, walking, marching and hiking: Secondary | ICD-10-CM | POA: Diagnosis present

## 2018-10-02 DIAGNOSIS — J45909 Unspecified asthma, uncomplicated: Secondary | ICD-10-CM | POA: Diagnosis present

## 2018-10-02 DIAGNOSIS — X58XXXA Exposure to other specified factors, initial encounter: Secondary | ICD-10-CM | POA: Diagnosis present

## 2018-10-02 DIAGNOSIS — M23632 Other spontaneous disruption of medial collateral ligament of left knee: Secondary | ICD-10-CM

## 2018-10-02 DIAGNOSIS — Z91013 Allergy to seafood: Secondary | ICD-10-CM | POA: Diagnosis not present

## 2018-10-02 DIAGNOSIS — E1165 Type 2 diabetes mellitus with hyperglycemia: Secondary | ICD-10-CM | POA: Diagnosis present

## 2018-10-02 DIAGNOSIS — Z419 Encounter for procedure for purposes other than remedying health state, unspecified: Secondary | ICD-10-CM

## 2018-10-02 DIAGNOSIS — S42302A Unspecified fracture of shaft of humerus, left arm, initial encounter for closed fracture: Secondary | ICD-10-CM

## 2018-10-02 DIAGNOSIS — S82142A Displaced bicondylar fracture of left tibia, initial encounter for closed fracture: Principal | ICD-10-CM | POA: Diagnosis present

## 2018-10-02 DIAGNOSIS — Z6837 Body mass index (BMI) 37.0-37.9, adult: Secondary | ICD-10-CM

## 2018-10-02 DIAGNOSIS — S83262A Peripheral tear of lateral meniscus, current injury, left knee, initial encounter: Secondary | ICD-10-CM | POA: Diagnosis present

## 2018-10-02 DIAGNOSIS — I10 Essential (primary) hypertension: Secondary | ICD-10-CM | POA: Diagnosis present

## 2018-10-02 DIAGNOSIS — S0081XA Abrasion of other part of head, initial encounter: Secondary | ICD-10-CM | POA: Diagnosis present

## 2018-10-02 DIAGNOSIS — S82842D Displaced bimalleolar fracture of left lower leg, subsequent encounter for closed fracture with routine healing: Secondary | ICD-10-CM | POA: Diagnosis not present

## 2018-10-02 DIAGNOSIS — M25512 Pain in left shoulder: Secondary | ICD-10-CM | POA: Diagnosis present

## 2018-10-02 DIAGNOSIS — T148XXA Other injury of unspecified body region, initial encounter: Secondary | ICD-10-CM

## 2018-10-02 DIAGNOSIS — S82142B Displaced bicondylar fracture of left tibia, initial encounter for open fracture type I or II: Secondary | ICD-10-CM

## 2018-10-02 HISTORY — DX: Anxiety disorder, unspecified: F41.9

## 2018-10-02 HISTORY — DX: Chest pain, unspecified: R07.9

## 2018-10-02 HISTORY — DX: Other chest pain: R07.89

## 2018-10-02 HISTORY — DX: Mild persistent asthma, uncomplicated: J45.30

## 2018-10-02 LAB — COMPREHENSIVE METABOLIC PANEL
ALT: 11 U/L (ref 0–44)
AST: 21 U/L (ref 15–41)
Albumin: 3.3 g/dL — ABNORMAL LOW (ref 3.5–5.0)
Alkaline Phosphatase: 73 U/L (ref 38–126)
Anion gap: 15 (ref 5–15)
BUN: 10 mg/dL (ref 6–20)
CO2: 20 mmol/L — ABNORMAL LOW (ref 22–32)
Calcium: 9.3 mg/dL (ref 8.9–10.3)
Chloride: 101 mmol/L (ref 98–111)
Creatinine, Ser: 0.74 mg/dL (ref 0.44–1.00)
GFR calc Af Amer: >60 mL/min (ref >60)
GFR calc non Af Amer: >60 mL/min (ref >60)
Glucose, Bld: 303 mg/dL — ABNORMAL HIGH (ref 70–99)
Potassium: 3.3 mmol/L — ABNORMAL LOW (ref 3.5–5.1)
Sodium: 136 mmol/L (ref 135–145)
Total Bilirubin: 0.3 mg/dL (ref 0.3–1.2)
Total Protein: 7.7 g/dL (ref 6.5–8.1)

## 2018-10-02 LAB — CBC
HCT: 34 % — ABNORMAL LOW (ref 36.0–46.0)
Hemoglobin: 10.9 g/dL — ABNORMAL LOW (ref 12.0–15.0)
MCH: 28.6 pg (ref 26.0–34.0)
MCHC: 32.1 g/dL (ref 30.0–36.0)
MCV: 89.2 fL (ref 80.0–100.0)
Platelets: 329 10*3/uL (ref 150–400)
RBC: 3.81 MIL/uL — ABNORMAL LOW (ref 3.87–5.11)
RDW: 13.1 % (ref 11.5–15.5)
WBC: 11.2 10*3/uL — ABNORMAL HIGH (ref 4.0–10.5)
nRBC: 0 % (ref 0.0–0.2)

## 2018-10-02 LAB — LACTIC ACID, PLASMA
Lactic Acid, Venous: 2.9 mmol/L (ref 0.5–1.9)
Lactic Acid, Venous: 3 mmol/L (ref 0.5–1.9)

## 2018-10-02 LAB — SURGICAL PCR SCREEN
MRSA, PCR: NEGATIVE
Staphylococcus aureus: NEGATIVE

## 2018-10-02 LAB — HEMOGLOBIN A1C
Hgb A1c MFr Bld: 10 % — ABNORMAL HIGH (ref 4.8–5.6)
Mean Plasma Glucose: 240.3 mg/dL

## 2018-10-02 LAB — GLUCOSE, CAPILLARY
Glucose-Capillary: 330 mg/dL — ABNORMAL HIGH (ref 70–99)
Glucose-Capillary: 270 mg/dL — ABNORMAL HIGH (ref 70–99)

## 2018-10-02 LAB — SAMPLE TO BLOOD BANK

## 2018-10-02 MED ORDER — PROMETHAZINE HCL 25 MG/ML IJ SOLN
12.5000 mg | Freq: Once | INTRAMUSCULAR | Status: AC
  Administered 2018-10-02: 11:00:00 12.5 mg via INTRAVENOUS
  Filled 2018-10-02: qty 1

## 2018-10-02 MED ORDER — METHOCARBAMOL 500 MG PO TABS
500.0000 mg | ORAL_TABLET | Freq: Four times a day (QID) | ORAL | Status: DC | PRN
  Administered 2018-10-02 – 2018-10-05 (×2): 500 mg via ORAL
  Filled 2018-10-02 (×4): qty 1

## 2018-10-02 MED ORDER — POVIDONE-IODINE 10 % EX SWAB: 2.0000 "application " | Freq: Once | CUTANEOUS | Status: DC

## 2018-10-02 MED ORDER — ONDANSETRON HCL 4 MG/2ML IJ SOLN
4.0000 mg | Freq: Four times a day (QID) | INTRAMUSCULAR | Status: DC | PRN
  Administered 2018-10-02 – 2018-10-05 (×6): 4 mg via INTRAVENOUS
  Filled 2018-10-02 (×9): qty 2

## 2018-10-02 MED ORDER — METHOCARBAMOL 1000 MG/10ML IJ SOLN
500.0000 mg | Freq: Four times a day (QID) | INTRAVENOUS | Status: DC | PRN
  Filled 2018-10-02: qty 5

## 2018-10-02 MED ORDER — MORPHINE SULFATE (PF) 4 MG/ML IV SOLN
4.0000 mg | INTRAVENOUS | Status: DC | PRN
  Administered 2018-10-02: 4 mg via INTRAVENOUS
  Filled 2018-10-02: qty 1

## 2018-10-02 MED ORDER — POTASSIUM CHLORIDE IN NACL 20-0.45 MEQ/L-% IV SOLN
INTRAVENOUS | Status: DC
  Administered 2018-10-02: 17:00:00 via INTRAVENOUS
  Filled 2018-10-02: qty 1000

## 2018-10-02 MED ORDER — INSULIN ASPART 100 UNIT/ML ~~LOC~~ SOLN
0.0000 [IU] | Freq: Three times a day (TID) | SUBCUTANEOUS | Status: DC
  Administered 2018-10-02: 11 [IU] via SUBCUTANEOUS
  Administered 2018-10-03 – 2018-10-04 (×3): 7 [IU] via SUBCUTANEOUS
  Administered 2018-10-04: 11 [IU] via SUBCUTANEOUS
  Administered 2018-10-05: 3 [IU] via SUBCUTANEOUS
  Administered 2018-10-05: 09:00:00 4 [IU] via SUBCUTANEOUS
  Administered 2018-10-05: 13:00:00 7 [IU] via SUBCUTANEOUS

## 2018-10-02 MED ORDER — ONDANSETRON HCL 4 MG PO TABS
4.0000 mg | ORAL_TABLET | Freq: Four times a day (QID) | ORAL | Status: DC | PRN
  Filled 2018-10-02: qty 1

## 2018-10-02 MED ORDER — TRAMADOL HCL 50 MG PO TABS
50.0000 mg | ORAL_TABLET | Freq: Four times a day (QID) | ORAL | Status: DC | PRN
  Administered 2018-10-02: 100 mg via ORAL
  Filled 2018-10-02: qty 2

## 2018-10-02 MED ORDER — LIP MEDEX EX OINT
TOPICAL_OINTMENT | CUTANEOUS | Status: DC | PRN
  Administered 2018-10-02: 18:00:00 via TOPICAL
  Filled 2018-10-02: qty 7

## 2018-10-02 MED ORDER — CHLORHEXIDINE GLUCONATE 4 % EX LIQD
60.0000 mL | Freq: Once | CUTANEOUS | Status: AC
  Administered 2018-10-02: 4 via TOPICAL

## 2018-10-02 MED ORDER — SODIUM CHLORIDE 0.9 % IV SOLN: INTRAVENOUS | Status: DC

## 2018-10-02 MED ORDER — ENOXAPARIN SODIUM 40 MG/0.4ML ~~LOC~~ SOLN
40.0000 mg | SUBCUTANEOUS | Status: DC
  Administered 2018-10-04 – 2018-10-05 (×2): 40 mg via SUBCUTANEOUS
  Filled 2018-10-02 (×2): qty 0.4

## 2018-10-02 MED ORDER — POTASSIUM CHLORIDE IN NACL 20-0.9 MEQ/L-% IV SOLN
INTRAVENOUS | Status: DC
  Administered 2018-10-03 – 2018-10-05 (×5): via INTRAVENOUS
  Filled 2018-10-02 (×6): qty 1000

## 2018-10-02 MED ORDER — INSULIN DETEMIR 100 UNIT/ML ~~LOC~~ SOLN
20.0000 [IU] | Freq: Every day | SUBCUTANEOUS | Status: DC
  Administered 2018-10-02 – 2018-10-03 (×2): 20 [IU] via SUBCUTANEOUS
  Filled 2018-10-02 (×3): qty 0.2

## 2018-10-02 MED ORDER — IOHEXOL 300 MG/ML  SOLN
100.0000 mL | Freq: Once | INTRAMUSCULAR | Status: AC | PRN
  Administered 2018-10-02: 13:00:00 100 mL via INTRAVENOUS

## 2018-10-02 MED ORDER — CEFAZOLIN SODIUM-DEXTROSE 2-4 GM/100ML-% IV SOLN
2.0000 g | INTRAVENOUS | Status: AC
  Administered 2018-10-03: 2 g via INTRAVENOUS
  Filled 2018-10-02: qty 100

## 2018-10-02 MED ORDER — MORPHINE SULFATE (PF) 2 MG/ML IV SOLN
2.0000 mg | INTRAVENOUS | Status: DC | PRN
  Administered 2018-10-02 – 2018-10-03 (×3): 2 mg via INTRAVENOUS
  Filled 2018-10-02 (×3): qty 1

## 2018-10-02 MED ORDER — ACETAMINOPHEN 500 MG PO TABS
500.0000 mg | ORAL_TABLET | Freq: Three times a day (TID) | ORAL | Status: DC
  Administered 2018-10-02 – 2018-10-03 (×2): 500 mg via ORAL
  Filled 2018-10-02 (×2): qty 1

## 2018-10-02 MED ORDER — PROMETHAZINE HCL 25 MG/ML IJ SOLN: 12.5000 mg | Freq: Once | INTRAMUSCULAR | Status: DC

## 2018-10-02 MED ORDER — MORPHINE SULFATE (PF) 4 MG/ML IV SOLN
4.0000 mg | Freq: Once | INTRAVENOUS | Status: AC
  Administered 2018-10-02: 4 mg via INTRAVENOUS
  Filled 2018-10-02: qty 1
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
## 2018-10-02 NOTE — Plan of Care (Signed)

## 2018-10-02 NOTE — Progress Notes (Signed)
Orthopedic Tech Progress Note Patient Details:  Angela Booth 08/17/1965 [id number]  Ortho Devices Type of Ortho Device: Shoulder immobilizer Ortho Device/Splint Interventions: Ordered, Application, Adjustment   Post Interventions Instructions Provided: Adjustment of device, Care of device   Shanese Riemenschneider J Orlie Cundari 10/02/2018, 1:06 PM

## 2018-10-02 NOTE — Progress Notes (Signed)
Orthopedic Tech Progress Note Patient Details:  Angela Booth 1966-01-07 [id number]  Ortho Devices Type of Ortho Device: Ace wrap, Knee Immobilizer, Coapt Ortho Device/Splint Interventions: Application   Post Interventions Patient Tolerated: Well Instructions Provided: Care of device   Saul Fordyce 10/02/2018, 2:57 PM

## 2018-10-03 ENCOUNTER — Inpatient Hospital Stay (HOSPITAL_COMMUNITY): Payer: No Typology Code available for payment source

## 2018-10-03 ENCOUNTER — Inpatient Hospital Stay (HOSPITAL_COMMUNITY): Payer: No Typology Code available for payment source | Admitting: Registered Nurse

## 2018-10-03 ENCOUNTER — Encounter (HOSPITAL_COMMUNITY)
Admission: EM | Disposition: A | Discharge status: Rehabilitation Facility | Payer: Self-pay | Source: Home / Self Care | Attending: Student

## 2018-10-03 ENCOUNTER — Encounter (HOSPITAL_COMMUNITY): Payer: Self-pay | Admitting: Registered Nurse

## 2018-10-03 HISTORY — PX: ORIF TIBIA PLATEAU: SHX2132

## 2018-10-03 HISTORY — PX: ORIF HUMERUS FRACTURE: SHX2126

## 2018-10-03 LAB — GLUCOSE, CAPILLARY
Glucose-Capillary: 205 mg/dL — ABNORMAL HIGH (ref 70–99)
Glucose-Capillary: 222 mg/dL — ABNORMAL HIGH (ref 70–99)
Glucose-Capillary: 200 mg/dL — ABNORMAL HIGH (ref 70–99)
Glucose-Capillary: 188 mg/dL — ABNORMAL HIGH (ref 70–99)
Glucose-Capillary: 184 mg/dL — ABNORMAL HIGH (ref 70–99)
Glucose-Capillary: 316 mg/dL — ABNORMAL HIGH (ref 70–99)

## 2018-10-03 LAB — LACTIC ACID, PLASMA: Lactic Acid, Venous: 1.2 mmol/L (ref 0.5–1.9)

## 2018-10-03 LAB — CBC
HCT: 28.7 % — ABNORMAL LOW (ref 36.0–46.0)
Hemoglobin: 9.3 g/dL — ABNORMAL LOW (ref 12.0–15.0)
MCH: 28.4 pg (ref 26.0–34.0)
MCHC: 32.4 g/dL (ref 30.0–36.0)
MCV: 87.8 fL (ref 80.0–100.0)
Platelets: 248 10*3/uL (ref 150–400)
RBC: 3.27 MIL/uL — ABNORMAL LOW (ref 3.87–5.11)
RDW: 13.2 % (ref 11.5–15.5)
WBC: 8.7 10*3/uL (ref 4.0–10.5)
nRBC: 0 % (ref 0.0–0.2)

## 2018-10-03 LAB — TSH: TSH: 0.101 u[IU]/mL — ABNORMAL LOW (ref 0.350–4.500)

## 2018-10-03 LAB — PREALBUMIN: Prealbumin: 13.4 mg/dL — ABNORMAL LOW (ref 18–38)

## 2018-10-03 LAB — HIV ANTIBODY (ROUTINE TESTING W REFLEX): HIV Screen 4th Generation wRfx: NONREACTIVE

## 2018-10-03 SURGERY — OPEN REDUCTION INTERNAL FIXATION (ORIF) HUMERAL SHAFT FRACTURE
Anesthesia: General | Site: Leg Lower | Laterality: Left

## 2018-10-03 MED ORDER — VANCOMYCIN HCL 1000 MG IV SOLR
INTRAVENOUS | Status: DC | PRN
  Administered 2018-10-03: 1000 mg

## 2018-10-03 MED ORDER — ACETAMINOPHEN 10 MG/ML IV SOLN
INTRAVENOUS | Status: AC
  Filled 2018-10-03: qty 100

## 2018-10-03 MED ORDER — ROCURONIUM BROMIDE 10 MG/ML (PF) SYRINGE
PREFILLED_SYRINGE | INTRAVENOUS | Status: DC | PRN
  Administered 2018-10-03: 50 mg via INTRAVENOUS
  Administered 2018-10-03: 30 mg via INTRAVENOUS
  Administered 2018-10-03 (×2): 20 mg via INTRAVENOUS

## 2018-10-03 MED ORDER — FENTANYL CITRATE (PF) 250 MCG/5ML IJ SOLN
INTRAMUSCULAR | Status: AC
  Filled 2018-10-03: qty 5

## 2018-10-03 MED ORDER — ONDANSETRON HCL 4 MG/2ML IJ SOLN
INTRAMUSCULAR | Status: DC | PRN
  Administered 2018-10-03: 4 mg via INTRAVENOUS

## 2018-10-03 MED ORDER — PROMETHAZINE HCL 25 MG/ML IJ SOLN
INTRAMUSCULAR | Status: AC
  Filled 2018-10-03: qty 1

## 2018-10-03 MED ORDER — SODIUM CHLORIDE 0.9 % IR SOLN
Status: DC | PRN
  Administered 2018-10-03: 3000 mL

## 2018-10-03 MED ORDER — TOBRAMYCIN SULFATE 1.2 G IJ SOLR
INTRAMUSCULAR | Status: AC
  Filled 2018-10-03: qty 1.2

## 2018-10-03 MED ORDER — ROCURONIUM BROMIDE 50 MG/5ML IV SOSY
PREFILLED_SYRINGE | INTRAVENOUS | Status: AC
  Filled 2018-10-03: qty 5

## 2018-10-03 MED ORDER — ACETAMINOPHEN 325 MG PO TABS
650.0000 mg | ORAL_TABLET | Freq: Four times a day (QID) | ORAL | Status: DC
  Administered 2018-10-03 – 2018-10-05 (×8): 650 mg via ORAL
  Filled 2018-10-03 (×8): qty 2

## 2018-10-03 MED ORDER — DEXAMETHASONE SODIUM PHOSPHATE 10 MG/ML IJ SOLN
INTRAMUSCULAR | Status: DC | PRN
  Administered 2018-10-03: 10 mg via INTRAVENOUS

## 2018-10-03 MED ORDER — VANCOMYCIN HCL 1000 MG IV SOLR
INTRAVENOUS | Status: AC
  Filled 2018-10-03: qty 1000

## 2018-10-03 MED ORDER — PROPOFOL 10 MG/ML IV BOLUS
INTRAVENOUS | Status: DC | PRN
  Administered 2018-10-03: 170 mg via INTRAVENOUS
  Administered 2018-10-03: 30 mg via INTRAVENOUS

## 2018-10-03 MED ORDER — PROMETHAZINE HCL 25 MG/ML IJ SOLN
6.2500 mg | INTRAMUSCULAR | Status: DC | PRN
  Administered 2018-10-03: 6.25 mg via INTRAVENOUS

## 2018-10-03 MED ORDER — ACETAMINOPHEN 10 MG/ML IV SOLN
INTRAVENOUS | Status: DC | PRN
  Administered 2018-10-03: 1000 mg via INTRAVENOUS

## 2018-10-03 MED ORDER — PROPOFOL 10 MG/ML IV BOLUS
INTRAVENOUS | Status: AC
  Filled 2018-10-03: qty 20

## 2018-10-03 MED ORDER — SUCCINYLCHOLINE CHLORIDE 200 MG/10ML IV SOSY
PREFILLED_SYRINGE | INTRAVENOUS | Status: AC
  Filled 2018-10-03: qty 10

## 2018-10-03 MED ORDER — DEXAMETHASONE SODIUM PHOSPHATE 10 MG/ML IJ SOLN
INTRAMUSCULAR | Status: AC
  Filled 2018-10-03: qty 1

## 2018-10-03 MED ORDER — CEFAZOLIN SODIUM-DEXTROSE 2-4 GM/100ML-% IV SOLN
2.0000 g | Freq: Three times a day (TID) | INTRAVENOUS | Status: AC
  Administered 2018-10-04 (×2): 2 g via INTRAVENOUS
  Filled 2018-10-03 (×3): qty 100

## 2018-10-03 MED ORDER — HYDROMORPHONE HCL 1 MG/ML IJ SOLN
INTRAMUSCULAR | Status: DC | PRN
  Administered 2018-10-03 (×2): .25 mg via INTRAVENOUS

## 2018-10-03 MED ORDER — LACTATED RINGERS IV SOLN
INTRAVENOUS | Status: DC | PRN
  Administered 2018-10-03 (×3): via INTRAVENOUS

## 2018-10-03 MED ORDER — MIDAZOLAM HCL 5 MG/5ML IJ SOLN
INTRAMUSCULAR | Status: DC | PRN
  Administered 2018-10-03: 2 mg via INTRAVENOUS

## 2018-10-03 MED ORDER — PHENYLEPHRINE 40 MCG/ML (10ML) SYRINGE FOR IV PUSH (FOR BLOOD PRESSURE SUPPORT)
PREFILLED_SYRINGE | INTRAVENOUS | Status: DC | PRN
  Administered 2018-10-03 (×3): 80 ug via INTRAVENOUS

## 2018-10-03 MED ORDER — LACTATED RINGERS IV SOLN
INTRAVENOUS | Status: DC
  Administered 2018-10-03: 11:00:00 via INTRAVENOUS

## 2018-10-03 MED ORDER — ONDANSETRON HCL 4 MG/2ML IJ SOLN
INTRAMUSCULAR | Status: AC
  Filled 2018-10-03: qty 2

## 2018-10-03 MED ORDER — KETOROLAC TROMETHAMINE 30 MG/ML IJ SOLN
INTRAMUSCULAR | Status: AC
  Filled 2018-10-03: qty 1

## 2018-10-03 MED ORDER — LIDOCAINE 2% (20 MG/ML) 5 ML SYRINGE
INTRAMUSCULAR | Status: AC
  Filled 2018-10-03: qty 5

## 2018-10-03 MED ORDER — BACITRACIN ZINC 500 UNIT/GM EX OINT: TOPICAL_OINTMENT | Freq: Every day | CUTANEOUS | Status: DC

## 2018-10-03 MED ORDER — OXYCODONE HCL 5 MG PO TABS
5.0000 mg | ORAL_TABLET | ORAL | Status: DC | PRN
  Filled 2018-10-03: qty 2

## 2018-10-03 MED ORDER — HYDROMORPHONE HCL 1 MG/ML IJ SOLN
INTRAMUSCULAR | Status: AC
  Filled 2018-10-03: qty 1

## 2018-10-03 MED ORDER — ESMOLOL HCL 100 MG/10ML IV SOLN
INTRAVENOUS | Status: DC | PRN
  Administered 2018-10-03: 10 mg via INTRAVENOUS
  Administered 2018-10-03: 30 mg via INTRAVENOUS

## 2018-10-03 MED ORDER — LIDOCAINE 2% (20 MG/ML) 5 ML SYRINGE
INTRAMUSCULAR | Status: DC | PRN
  Administered 2018-10-03: 60 mg via INTRAVENOUS

## 2018-10-03 MED ORDER — MIDAZOLAM HCL 2 MG/2ML IJ SOLN
INTRAMUSCULAR | Status: AC
  Filled 2018-10-03: qty 2

## 2018-10-03 MED ORDER — HYDROMORPHONE HCL 1 MG/ML IJ SOLN
INTRAMUSCULAR | Status: AC
  Filled 2018-10-03: qty 0.5

## 2018-10-03 MED ORDER — HYDROMORPHONE HCL 1 MG/ML IJ SOLN
0.2500 mg | INTRAMUSCULAR | Status: DC | PRN
  Administered 2018-10-03: 0.5 mg via INTRAVENOUS

## 2018-10-03 MED ORDER — PHENYLEPHRINE 40 MCG/ML (10ML) SYRINGE FOR IV PUSH (FOR BLOOD PRESSURE SUPPORT)
PREFILLED_SYRINGE | INTRAVENOUS | Status: AC
  Filled 2018-10-03: qty 10

## 2018-10-03 MED ORDER — BACITRACIN ZINC 500 UNIT/GM EX OINT
TOPICAL_OINTMENT | CUTANEOUS | Status: AC
  Filled 2018-10-03: qty 28.35

## 2018-10-03 MED ORDER — 0.9 % SODIUM CHLORIDE (POUR BTL) OPTIME
TOPICAL | Status: DC | PRN
  Administered 2018-10-03: 1000 mL

## 2018-10-03 MED ORDER — SUCCINYLCHOLINE CHLORIDE 200 MG/10ML IV SOSY
PREFILLED_SYRINGE | INTRAVENOUS | Status: DC | PRN
  Administered 2018-10-03: 100 mg via INTRAVENOUS

## 2018-10-03 MED ORDER — GABAPENTIN 100 MG PO CAPS
100.0000 mg | ORAL_CAPSULE | Freq: Three times a day (TID) | ORAL | Status: DC
  Administered 2018-10-03 – 2018-10-05 (×6): 100 mg via ORAL
  Filled 2018-10-03 (×6): qty 1

## 2018-10-03 MED ORDER — ACETAMINOPHEN 10 MG/ML IV SOLN: 1000.0000 mg | Freq: Once | INTRAVENOUS | Status: DC | PRN

## 2018-10-03 MED ORDER — FENTANYL CITRATE (PF) 250 MCG/5ML IJ SOLN
INTRAMUSCULAR | Status: DC | PRN
  Administered 2018-10-03 (×2): 50 ug via INTRAVENOUS
  Administered 2018-10-03: 25 ug via INTRAVENOUS
  Administered 2018-10-03: 50 ug via INTRAVENOUS
  Administered 2018-10-03: 25 ug via INTRAVENOUS
  Administered 2018-10-03 (×2): 50 ug via INTRAVENOUS
  Administered 2018-10-03: 150 ug via INTRAVENOUS
  Administered 2018-10-03: 50 ug via INTRAVENOUS

## 2018-10-03 MED ORDER — KETOROLAC TROMETHAMINE 15 MG/ML IJ SOLN
INTRAMUSCULAR | Status: DC | PRN
  Administered 2018-10-03: 15 mg via INTRAVENOUS

## 2018-10-03 MED ORDER — SUGAMMADEX SODIUM 200 MG/2ML IV SOLN
INTRAVENOUS | Status: DC | PRN
  Administered 2018-10-03: 100 mg via INTRAVENOUS
  Administered 2018-10-03: 200 mg via INTRAVENOUS

## 2018-10-03 MED ORDER — TOBRAMYCIN SULFATE 1.2 G IJ SOLR
INTRAMUSCULAR | Status: DC | PRN
  Administered 2018-10-03: 1.2 g

## 2018-10-03 MED ORDER — MORPHINE SULFATE (PF) 2 MG/ML IV SOLN
2.0000 mg | INTRAVENOUS | Status: DC | PRN
  Administered 2018-10-03 – 2018-10-04 (×5): 2 mg via INTRAVENOUS
  Filled 2018-10-03 (×7): qty 1

## 2018-10-03 MED ORDER — EPHEDRINE 5 MG/ML INJ
INTRAVENOUS | Status: AC
  Filled 2018-10-03: qty 10

## 2018-10-03 SURGICAL SUPPLY — 117 items
BANDAGE ACE 3X5.8 VEL STRL LF (GAUZE/BANDAGES/DRESSINGS) ×4 IMPLANT
BANDAGE ACE 4X5 VEL STRL LF (GAUZE/BANDAGES/DRESSINGS) ×4 IMPLANT
BANDAGE ACE 6X5 VEL STRL LF (GAUZE/BANDAGES/DRESSINGS) ×4 IMPLANT
BANDAGE ELASTIC 4 VELCRO ST LF (GAUZE/BANDAGES/DRESSINGS) ×4 IMPLANT
BANDAGE ELASTIC 6 VELCRO ST LF (GAUZE/BANDAGES/DRESSINGS) ×4 IMPLANT
BANDAGE ESMARK 6X9 LF (GAUZE/BANDAGES/DRESSINGS) ×2 IMPLANT
BIT DRILL 2.5 X LONG (BIT) ×2
BIT DRILL CALIBR QC 2.8X250 (BIT) ×4 IMPLANT
BIT DRILL Q/COUPLING 1 (BIT) ×4 IMPLANT
BIT DRILL QC 2.7MM (BIT) ×2 IMPLANT
BIT DRILL QC 2X125 (BIT) ×2 IMPLANT
BIT DRILL QC 3.5X110 (BIT) ×4 IMPLANT
BIT DRILL X LONG 2.5 (BIT) ×2 IMPLANT
BLADE 15 SAFETY STRL DISP (BLADE) ×8 IMPLANT
BLADE CLIPPER SURG (BLADE) IMPLANT
BLADE SURG 15 STRL LF DISP TIS (BLADE) ×2 IMPLANT
BLADE SURG 15 STRL SS (BLADE) ×2
BNDG COHESIVE 4X5 TAN STRL (GAUZE/BANDAGES/DRESSINGS) ×4 IMPLANT
BNDG ELASTIC 2X5.8 VLCR STR LF (GAUZE/BANDAGES/DRESSINGS) ×4 IMPLANT
BNDG ELASTIC 6X15 VLCR STRL LF (GAUZE/BANDAGES/DRESSINGS) ×4 IMPLANT
BNDG ESMARK 6X9 LF (GAUZE/BANDAGES/DRESSINGS) ×4
BNDG GAUZE ELAST 4 BULKY (GAUZE/BANDAGES/DRESSINGS) ×4 IMPLANT
BRUSH SCRUB SURG 4.25 DISP (MISCELLANEOUS) ×16 IMPLANT
CANISTER SUCT 3000ML PPV (MISCELLANEOUS) ×4 IMPLANT
CANISTER WOUNDNEG PRESSURE 500 (CANNISTER) ×4 IMPLANT
CHLORAPREP W/TINT 26 (MISCELLANEOUS) ×16 IMPLANT
CHLORAPREP W/TINT 26ML (MISCELLANEOUS) IMPLANT
COVER SURGICAL LIGHT HANDLE (MISCELLANEOUS) ×8 IMPLANT
COVER WAND RF STERILE (DRAPES) ×4 IMPLANT
CUFF TOURNIQUET SINGLE 34IN LL (TOURNIQUET CUFF) ×4 IMPLANT
DERMABOND ADVANCED (GAUZE/BANDAGES/DRESSINGS) ×4
DERMABOND ADVANCED .7 DNX12 (GAUZE/BANDAGES/DRESSINGS) ×4 IMPLANT
DRAPE C-ARM 42X72 X-RAY (DRAPES) ×4 IMPLANT
DRAPE C-ARMOR (DRAPES) ×4 IMPLANT
DRAPE INCISE IOBAN 66X45 STRL (DRAPES) ×4 IMPLANT
DRAPE ORTHO SPLIT 77X108 STRL (DRAPES) ×4
DRAPE ORTHO SPLIT ENLRG (DRAPES) ×16 IMPLANT
DRAPE SURG 17X23 STRL (DRAPES) ×4 IMPLANT
DRAPE SURG ORHT 6 SPLT 77X108 (DRAPES) ×4 IMPLANT
DRAPE U-SHAPE 47X51 STRL (DRAPES) ×16 IMPLANT
DRILL BIT QC 2X125 (BIT) ×2
DRILL BIT X LONG 2.5 (BIT) ×2
DRSG ADAPTIC 3X8 NADH LF (GAUZE/BANDAGES/DRESSINGS) ×8 IMPLANT
DRSG MEPILEX BORDER 4X8 (GAUZE/BANDAGES/DRESSINGS) ×4 IMPLANT
DRSG PAD ABDOMINAL 8X10 ST (GAUZE/BANDAGES/DRESSINGS) ×8 IMPLANT
DRSG VAC ATS SM SENSATRAC (GAUZE/BANDAGES/DRESSINGS) ×4 IMPLANT
ELECT REM PT RETURN 9FT ADLT (ELECTROSURGICAL) ×4
ELECTRODE REM PT RTRN 9FT ADLT (ELECTROSURGICAL) ×2 IMPLANT
EVACUATOR 1/8 PVC DRAIN (DRAIN) IMPLANT
GAUZE SPONGE 4X4 12PLY STRL (GAUZE/BANDAGES/DRESSINGS) ×4 IMPLANT
GLOVE BIO SURGEON STRL SZ 6.5 (GLOVE) ×9 IMPLANT
GLOVE BIO SURGEON STRL SZ7.5 (GLOVE) ×16 IMPLANT
GLOVE BIO SURGEONS STRL SZ 6.5 (GLOVE) ×3
GLOVE BIOGEL PI IND STRL 6.5 (GLOVE) ×2 IMPLANT
GLOVE BIOGEL PI IND STRL 7.5 (GLOVE) ×2 IMPLANT
GLOVE BIOGEL PI INDICATOR 6.5 (GLOVE) ×2
GLOVE BIOGEL PI INDICATOR 7.5 (GLOVE) ×2
GOWN STRL REUS W/ TWL LRG LVL3 (GOWN DISPOSABLE) ×4 IMPLANT
GOWN STRL REUS W/TWL LRG LVL3 (GOWN DISPOSABLE) ×4
IMMOBILIZER KNEE 22 UNIV (SOFTGOODS) IMPLANT
KIT BASIN OR (CUSTOM PROCEDURE TRAY) ×4 IMPLANT
KIT TURNOVER KIT B (KITS) ×4 IMPLANT
MANIFOLD NEPTUNE II (INSTRUMENTS) ×4 IMPLANT
NDL SUT 6 .5 CRC .975X.05 MAYO (NEEDLE) ×2 IMPLANT
NEEDLE HYPO 25X1 1.5 SAFETY (NEEDLE) ×4 IMPLANT
NEEDLE MAYO TAPER (NEEDLE) ×2
NS IRRIG 1000ML POUR BTL (IV SOLUTION) ×4 IMPLANT
PACK ORTHO EXTREMITY (CUSTOM PROCEDURE TRAY) ×4 IMPLANT
PACK TOTAL JOINT (CUSTOM PROCEDURE TRAY) ×4 IMPLANT
PAD ARMBOARD 7.5X6 YLW CONV (MISCELLANEOUS) ×8 IMPLANT
PAD CAST 4YDX4 CTTN HI CHSV (CAST SUPPLIES) ×4 IMPLANT
PADDING CAST COTTON 4X4 STRL (CAST SUPPLIES) ×4
PADDING CAST COTTON 6X4 STRL (CAST SUPPLIES) ×4 IMPLANT
PLATE LOCKING 8 HOLE (Plate) ×4 IMPLANT
PLATE PROX TIB LT 3.5 VA-LCP (Plate) ×4 IMPLANT
PROS DRILL BIT QC 2.7MM (BIT) ×4
SCREW BN 2.5XFT 20X2.7X5X CORT (Screw) ×4 IMPLANT
SCREW CORT HEADED ST 3.5X32 (Screw) ×4 IMPLANT
SCREW CORT ST 2.7X20 (Screw) ×4 IMPLANT
SCREW CORTEX ST 4.5X22 (Screw) ×4 IMPLANT
SCREW CORTEX ST 4.5X24 (Screw) ×12 IMPLANT
SCREW CORTEX ST 4.5X26 (Screw) ×12 IMPLANT
SCREW HEADED ST 3.5X38 (Screw) ×4 IMPLANT
SCREW HEADED ST 3.5X60 (Screw) ×4 IMPLANT
SCREW HEADED ST 3.5X70 (Screw) ×4 IMPLANT
SCREW LOCKING 3.5X70MM VA (Screw) ×8 IMPLANT
SCREW VA-LOCKING 65MM 3.5 (Screw) ×4 IMPLANT
SPONGE LAP 18X18 RF (DISPOSABLE) ×4 IMPLANT
STAPLER VISISTAT 35W (STAPLE) ×4 IMPLANT
SUCTION FRAZIER HANDLE 10FR (MISCELLANEOUS) ×2
SUCTION TUBE FRAZIER 10FR DISP (MISCELLANEOUS) ×2 IMPLANT
SUT ETHILON 2 0 FS 18 (SUTURE) ×40 IMPLANT
SUT ETHILON 3 0 PS 1 (SUTURE) ×8 IMPLANT
SUT FIBERWIRE #2 38 T-5 BLUE (SUTURE) ×16
SUT FIBERWIRE 2-0 18 17.9 3/8 (SUTURE)
SUT MNCRL AB 3-0 PS2 18 (SUTURE) ×4 IMPLANT
SUT MON AB 2-0 CT1 36 (SUTURE) ×12 IMPLANT
SUT PDS AB 0 CT 36 (SUTURE) ×4 IMPLANT
SUT PROLENE 0 CT (SUTURE) IMPLANT
SUT VIC AB 0 CT1 27 (SUTURE) ×4
SUT VIC AB 0 CT1 27XBRD ANBCTR (SUTURE) ×4 IMPLANT
SUT VIC AB 1 CT1 18XCR BRD 8 (SUTURE) ×2 IMPLANT
SUT VIC AB 1 CT1 27 (SUTURE) ×2
SUT VIC AB 1 CT1 27XBRD ANBCTR (SUTURE) ×2 IMPLANT
SUT VIC AB 1 CT1 8-18 (SUTURE) ×2
SUT VIC AB 2-0 CT1 27 (SUTURE) ×4
SUT VIC AB 2-0 CT1 TAPERPNT 27 (SUTURE) ×4 IMPLANT
SUTURE FIBERWR #2 38 T-5 BLUE (SUTURE) ×8 IMPLANT
SUTURE FIBERWR 2-0 18 17.9 3/8 (SUTURE) IMPLANT
SYR CONTROL 10ML LL (SYRINGE) ×4 IMPLANT
TOWEL OR 17X24 6PK STRL BLUE (TOWEL DISPOSABLE) ×4 IMPLANT
TOWEL OR 17X26 10 PK STRL BLUE (TOWEL DISPOSABLE) ×12 IMPLANT
TRAY FOLEY CATH 14FR (SET/KITS/TRAYS/PACK) ×4 IMPLANT
TUBE CONNECTING 12'X1/4 (SUCTIONS) ×1
TUBE CONNECTING 12X1/4 (SUCTIONS) ×3 IMPLANT
WATER STERILE IRR 1000ML POUR (IV SOLUTION) ×8 IMPLANT
YANKAUER SUCT BULB TIP NO VENT (SUCTIONS) ×4 IMPLANT

## 2018-10-03 NOTE — H&P (Signed)
Please see orthopaedic consult note for full H&P 

## 2018-10-03 NOTE — Anesthesia Procedure Notes (Signed)
Procedure Name: Intubation Date/Time: 10/03/2018 12:08 PM Performed by: Zollie Scale, CRNA Pre-anesthesia Checklist: Patient identified, Emergency Drugs available, Suction available and Patient being monitored Patient Re-evaluated:Patient Re-evaluated prior to induction Oxygen Delivery Method: Circle System Utilized Preoxygenation: Pre-oxygenation with 100% oxygen Induction Type: IV induction and Rapid sequence Laryngoscope Size: Miller and 2 Grade View: Grade I Tube type: Oral Tube size: 7.0 mm Number of attempts: 1 Airway Equipment and Method: Stylet and Oral airway Placement Confirmation: ETT inserted through vocal cords under direct vision,  positive ETCO2 and breath sounds checked- equal and bilateral Secured at: 22 cm Tube secured with: Tape Dental Injury: Teeth and Oropharynx as per pre-operative assessment

## 2018-10-03 NOTE — Transfer of Care (Signed)
Immediate Anesthesia Transfer of Care Note  Patient: Angela Booth  Procedure(s) Performed: OPEN REDUCTION INTERNAL FIXATION (ORIF) HUMERAL SHAFT FRACTURE (Left Arm Lower) OPEN REDUCTION INTERNAL FIXATION (ORIF) TIBIAL PLATEAU (Left Leg Lower)  Patient Location: PACU  Anesthesia Type:General  Level of Consciousness: drowsy, patient cooperative and responds to stimulation  Airway & Oxygen Therapy: Patient Spontanous Breathing and Patient connected to face mask oxygen  Post-op Assessment: Report given to RN, Post -op Vital signs reviewed and stable, Patient moving all extremities and Patient moving all extremities X 4  Post vital signs: Reviewed and stable  Last Vitals:  Vitals Value Taken Time  BP 150/83 10/03/2018  4:27 PM  Temp    Pulse 91 10/03/2018  4:29 PM  Resp 14 10/03/2018  4:29 PM  SpO2 100 % 10/03/2018  4:29 PM  Vitals shown include unvalidated device data.  Last Pain:  Vitals:   10/03/18 1000  TempSrc: Oral  PainSc:          Complications: No apparent anesthesia complications
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
## 2018-10-03 NOTE — Progress Notes (Signed)
Doing well this am. Sore.  No change in examination LUEX or LLEX. H&H stable.  Surgery with Dr. Jena Gauss later this am.  Myrene Galas, MD [facility], [facility] [phone]

## 2018-10-03 NOTE — Progress Notes (Signed)
Reviewed case with Dr. Carola Frost. Plan for ORIF left humerus and left tibial plateau. Risks and benefits discussed with patient. Agrees to proceed with surgery.  LUE and LLE patient is neurovascularly intact. No injuries to RLE/RUE.
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
## 2018-10-04 ENCOUNTER — Encounter (HOSPITAL_COMMUNITY): Payer: Self-pay | Admitting: Student

## 2018-10-04 DIAGNOSIS — S42302A Unspecified fracture of shaft of humerus, left arm, initial encounter for closed fracture: Secondary | ICD-10-CM

## 2018-10-04 DIAGNOSIS — S83262A Peripheral tear of lateral meniscus, current injury, left knee, initial encounter: Secondary | ICD-10-CM

## 2018-10-04 DIAGNOSIS — M23632 Other spontaneous disruption of medial collateral ligament of left knee: Secondary | ICD-10-CM

## 2018-10-04 LAB — CBC
HCT: 25.6 % — ABNORMAL LOW (ref 36.0–46.0)
Hemoglobin: 8.6 g/dL — ABNORMAL LOW (ref 12.0–15.0)
MCH: 29.6 pg (ref 26.0–34.0)
MCHC: 33.6 g/dL (ref 30.0–36.0)
MCV: 88 fL (ref 80.0–100.0)
Platelets: 243 10*3/uL (ref 150–400)
RBC: 2.91 MIL/uL — ABNORMAL LOW (ref 3.87–5.11)
RDW: 13.2 % (ref 11.5–15.5)
WBC: 10.1 10*3/uL (ref 4.0–10.5)
nRBC: 0 % (ref 0.0–0.2)

## 2018-10-04 LAB — PTH, INTACT AND CALCIUM
Calcium, Total (PTH): 8.8 mg/dL (ref 8.7–10.2)
PTH: 16 pg/mL (ref 15–65)

## 2018-10-04 LAB — GLUCOSE, CAPILLARY
Glucose-Capillary: 247 mg/dL — ABNORMAL HIGH (ref 70–99)
Glucose-Capillary: 221 mg/dL — ABNORMAL HIGH (ref 70–99)
Glucose-Capillary: 253 mg/dL — ABNORMAL HIGH (ref 70–99)
Glucose-Capillary: 215 mg/dL — ABNORMAL HIGH (ref 70–99)

## 2018-10-04 MED ORDER — KETOROLAC TROMETHAMINE 15 MG/ML IJ SOLN
15.0000 mg | Freq: Four times a day (QID) | INTRAMUSCULAR | Status: AC
  Administered 2018-10-04 – 2018-10-05 (×5): 15 mg via INTRAVENOUS
  Filled 2018-10-04 (×5): qty 1

## 2018-10-04 MED ORDER — INSULIN DETEMIR 100 UNIT/ML ~~LOC~~ SOLN
20.0000 [IU] | Freq: Two times a day (BID) | SUBCUTANEOUS | Status: DC
  Administered 2018-10-04 – 2018-10-05 (×2): 20 [IU] via SUBCUTANEOUS
  Filled 2018-10-04 (×4): qty 0.2

## 2018-10-04 NOTE — Progress Notes (Signed)
Rehab Admissions Coordinator Note:  Patient was screened by Clois Dupes for appropriateness for an Inpatient Acute Rehab Consult per PT recommendation.  At this time, we are recommending Inpatient Rehab consult if patient would like to be considered for admit.   Clois Dupes RN MSN 10/04/2018, 2:15 PM  I can be reached at [phone].
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
## 2018-10-04 NOTE — Plan of Care (Signed)
  Problem: Pain Managment: Goal: General experience of comfort will improve Outcome: Progressing   Problem: Skin Integrity: Goal: Risk for impaired skin integrity will decrease Outcome: Progressing   
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
## 2018-10-04 NOTE — Progress Notes (Signed)
Orthopedic Tech Progress Note Patient Details:  Angela Booth January 12, 1966 [id number] Called in Order to [facility]  Patient ID: Angela Booth, female   DOB: May 25, 1966, 53 y.o.   MRN: [id number]   Donald Pore 10/04/2018, 9:01 AM

## 2018-10-04 NOTE — Progress Notes (Signed)
Increased patient's Insulin detemir dose to 20 unit BID per pharmacy recommendation. Her home dose is Insulin detemir 30 unit twice daily. Will continue to monitor glucose levels.    Angela Booth [name] ?[phone]? (phone)
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
## 2018-10-04 NOTE — Anesthesia Postprocedure Evaluation (Signed)
Anesthesia Post Note  Patient: Angela Booth  Procedure(s) Performed: OPEN REDUCTION INTERNAL FIXATION (ORIF) HUMERAL SHAFT FRACTURE (Left Arm Lower) OPEN REDUCTION INTERNAL FIXATION (ORIF) TIBIAL PLATEAU (Left Leg Lower)     Patient location during evaluation: PACU Anesthesia Type: General Level of consciousness: awake and alert Pain management: pain level controlled Vital Signs Assessment: post-procedure vital signs reviewed and stable Respiratory status: spontaneous breathing, nonlabored ventilation, respiratory function stable and patient connected to nasal cannula oxygen Cardiovascular status: blood pressure returned to baseline and stable Postop Assessment: no apparent nausea or vomiting Anesthetic complications: no    Last Vitals:  Vitals:   10/04/18 1400 10/04/18 1726  BP: (!) 122/97 130/73  Pulse: (!) 105 93  Resp: 20 18  Temp: 37.1 C 36.9 C  SpO2: 100% 97%    Last Pain:  Vitals:   10/04/18 1726  TempSrc: Oral  PainSc:                  Gertrue Willette P Gamaliel Charney

## 2018-10-05 ENCOUNTER — Other Ambulatory Visit: Payer: Self-pay

## 2018-10-05 ENCOUNTER — Encounter (HOSPITAL_COMMUNITY): Payer: Self-pay

## 2018-10-05 ENCOUNTER — Encounter (HOSPITAL_COMMUNITY): Payer: Self-pay | Admitting: Physical Medicine and Rehabilitation

## 2018-10-05 ENCOUNTER — Inpatient Hospital Stay (HOSPITAL_COMMUNITY)
Admission: RE | Admit: 2018-10-05 | Discharge: 2018-10-19 | DRG: 560 | Disposition: A | Discharge status: Home Health | Payer: No Typology Code available for payment source | Source: Intra-hospital | Attending: Physical Medicine & Rehabilitation | Admitting: Physical Medicine & Rehabilitation

## 2018-10-05 DIAGNOSIS — Z794 Long term (current) use of insulin: Secondary | ICD-10-CM | POA: Diagnosis not present

## 2018-10-05 DIAGNOSIS — E1165 Type 2 diabetes mellitus with hyperglycemia: Secondary | ICD-10-CM | POA: Diagnosis present

## 2018-10-05 DIAGNOSIS — E876 Hypokalemia: Secondary | ICD-10-CM | POA: Diagnosis present

## 2018-10-05 DIAGNOSIS — S83282D Other tear of lateral meniscus, current injury, left knee, subsequent encounter: Secondary | ICD-10-CM

## 2018-10-05 DIAGNOSIS — E669 Obesity, unspecified: Secondary | ICD-10-CM

## 2018-10-05 DIAGNOSIS — S82122A Displaced fracture of lateral condyle of left tibia, initial encounter for closed fracture: Secondary | ICD-10-CM

## 2018-10-05 DIAGNOSIS — I10 Essential (primary) hypertension: Secondary | ICD-10-CM | POA: Diagnosis present

## 2018-10-05 DIAGNOSIS — Z713 Dietary counseling and surveillance: Secondary | ICD-10-CM

## 2018-10-05 DIAGNOSIS — Z90710 Acquired absence of both cervix and uterus: Secondary | ICD-10-CM

## 2018-10-05 DIAGNOSIS — Z6841 Body Mass Index (BMI) 40.0 and over, adult: Secondary | ICD-10-CM | POA: Diagnosis not present

## 2018-10-05 DIAGNOSIS — S82142A Displaced bicondylar fracture of left tibia, initial encounter for closed fracture: Secondary | ICD-10-CM

## 2018-10-05 DIAGNOSIS — Z79890 Hormone replacement therapy (postmenopausal): Secondary | ICD-10-CM

## 2018-10-05 DIAGNOSIS — Z79899 Other long term (current) drug therapy: Secondary | ICD-10-CM | POA: Diagnosis not present

## 2018-10-05 DIAGNOSIS — D62 Acute posthemorrhagic anemia: Secondary | ICD-10-CM

## 2018-10-05 DIAGNOSIS — S42302D Unspecified fracture of shaft of humerus, left arm, subsequent encounter for fracture with routine healing: Secondary | ICD-10-CM

## 2018-10-05 DIAGNOSIS — R7309 Other abnormal glucose: Secondary | ICD-10-CM

## 2018-10-05 DIAGNOSIS — S82842D Displaced bimalleolar fracture of left lower leg, subsequent encounter for closed fracture with routine healing: Principal | ICD-10-CM

## 2018-10-05 DIAGNOSIS — Z79891 Long term (current) use of opiate analgesic: Secondary | ICD-10-CM

## 2018-10-05 DIAGNOSIS — S42302A Unspecified fracture of shaft of humerus, left arm, initial encounter for closed fracture: Secondary | ICD-10-CM | POA: Diagnosis present

## 2018-10-05 DIAGNOSIS — E1169 Type 2 diabetes mellitus with other specified complication: Secondary | ICD-10-CM

## 2018-10-05 DIAGNOSIS — T148XXA Other injury of unspecified body region, initial encounter: Secondary | ICD-10-CM

## 2018-10-05 DIAGNOSIS — G8918 Other acute postprocedural pain: Secondary | ICD-10-CM

## 2018-10-05 DIAGNOSIS — T1490XA Injury, unspecified, initial encounter: Secondary | ICD-10-CM | POA: Diagnosis present

## 2018-10-05 DIAGNOSIS — F419 Anxiety disorder, unspecified: Secondary | ICD-10-CM | POA: Diagnosis present

## 2018-10-05 DIAGNOSIS — S42352A Displaced comminuted fracture of shaft of humerus, left arm, initial encounter for closed fracture: Secondary | ICD-10-CM

## 2018-10-05 DIAGNOSIS — T07XXXA Unspecified multiple injuries, initial encounter: Secondary | ICD-10-CM

## 2018-10-05 DIAGNOSIS — S82122D Displaced fracture of lateral condyle of left tibia, subsequent encounter for closed fracture with routine healing: Secondary | ICD-10-CM

## 2018-10-05 LAB — BASIC METABOLIC PANEL
Anion gap: 10 (ref 5–15)
BUN: <5 mg/dL — ABNORMAL LOW (ref 6–20)
CO2: 26 mmol/L (ref 22–32)
Calcium: 8.6 mg/dL — ABNORMAL LOW (ref 8.9–10.3)
Chloride: 102 mmol/L (ref 98–111)
Creatinine, Ser: 0.76 mg/dL (ref 0.44–1.00)
GFR calc Af Amer: >60 mL/min (ref >60)
GFR calc non Af Amer: >60 mL/min (ref >60)
Glucose, Bld: 250 mg/dL — ABNORMAL HIGH (ref 70–99)
Potassium: 3.5 mmol/L (ref 3.5–5.1)
Sodium: 138 mmol/L (ref 135–145)

## 2018-10-05 LAB — CBC
HCT: 26.5 % — ABNORMAL LOW (ref 36.0–46.0)
Hemoglobin: 8.4 g/dL — ABNORMAL LOW (ref 12.0–15.0)
MCH: 28.2 pg (ref 26.0–34.0)
MCHC: 31.7 g/dL (ref 30.0–36.0)
MCV: 88.9 fL (ref 80.0–100.0)
Platelets: 268 10*3/uL (ref 150–400)
RBC: 2.98 MIL/uL — ABNORMAL LOW (ref 3.87–5.11)
RDW: 13.4 % (ref 11.5–15.5)
WBC: 9.5 10*3/uL (ref 4.0–10.5)
nRBC: 0.3 % — ABNORMAL HIGH (ref 0.0–0.2)

## 2018-10-05 LAB — GLUCOSE, CAPILLARY
Glucose-Capillary: 154 mg/dL — ABNORMAL HIGH (ref 70–99)
Glucose-Capillary: 228 mg/dL — ABNORMAL HIGH (ref 70–99)
Glucose-Capillary: 136 mg/dL — ABNORMAL HIGH (ref 70–99)
Glucose-Capillary: 210 mg/dL — ABNORMAL HIGH (ref 70–99)

## 2018-10-05 MED ORDER — MORPHINE SULFATE 15 MG PO TABS
15.0000 mg | ORAL_TABLET | ORAL | Status: DC | PRN
  Administered 2018-10-05 – 2018-10-12 (×11): 15 mg via ORAL
  Filled 2018-10-05 (×11): qty 1

## 2018-10-05 MED ORDER — THYROID 120 MG PO TABS
120.0000 mg | ORAL_TABLET | Freq: Every day | ORAL | Status: DC
  Administered 2018-10-06 – 2018-10-19 (×14): 120 mg via ORAL
  Filled 2018-10-05 (×15): qty 1

## 2018-10-05 MED ORDER — MONTELUKAST SODIUM 10 MG PO TABS
10.0000 mg | ORAL_TABLET | Freq: Every day | ORAL | Status: DC
  Administered 2018-10-05 – 2018-10-18 (×14): 10 mg via ORAL
  Filled 2018-10-05 (×14): qty 1

## 2018-10-05 MED ORDER — BISACODYL 10 MG RE SUPP
10.0000 mg | Freq: Every day | RECTAL | Status: DC | PRN
  Administered 2018-10-06: 10 mg via RECTAL
  Filled 2018-10-05: qty 1

## 2018-10-05 MED ORDER — PROCHLORPERAZINE EDISYLATE 10 MG/2ML IJ SOLN: 5.0000 mg | Freq: Four times a day (QID) | INTRAMUSCULAR | Status: DC | PRN

## 2018-10-05 MED ORDER — ACETAMINOPHEN 325 MG PO TABS
650.0000 mg | ORAL_TABLET | Freq: Four times a day (QID) | ORAL | Status: DC
  Administered 2018-10-06 – 2018-10-19 (×51): 650 mg via ORAL
  Filled 2018-10-05 (×53): qty 2

## 2018-10-05 MED ORDER — METHOCARBAMOL 500 MG PO TABS
500.0000 mg | ORAL_TABLET | Freq: Four times a day (QID) | ORAL | Status: DC | PRN
  Administered 2018-10-05 – 2018-10-11 (×2): 500 mg via ORAL
  Filled 2018-10-05 (×4): qty 1

## 2018-10-05 MED ORDER — ACETAMINOPHEN 325 MG PO TABS
325.0000 mg | ORAL_TABLET | ORAL | Status: DC | PRN
  Administered 2018-10-07 – 2018-10-18 (×2): 650 mg via ORAL
  Filled 2018-10-05: qty 2

## 2018-10-05 MED ORDER — VITAMINS A & D EX OINT
TOPICAL_OINTMENT | Freq: Two times a day (BID) | CUTANEOUS | Status: DC
  Administered 2018-10-05 – 2018-10-15 (×20): via TOPICAL
  Administered 2018-10-15: 1 via TOPICAL
  Administered 2018-10-16 – 2018-10-19 (×7): via TOPICAL
  Filled 2018-10-05: qty 56.7

## 2018-10-05 MED ORDER — TRAZODONE HCL 50 MG PO TABS
25.0000 mg | ORAL_TABLET | Freq: Every evening | ORAL | Status: DC | PRN
  Administered 2018-10-08 – 2018-10-10 (×3): 25 mg via ORAL
  Filled 2018-10-05 (×3): qty 1

## 2018-10-05 MED ORDER — INSULIN ASPART 100 UNIT/ML ~~LOC~~ SOLN
0.0000 [IU] | Freq: Every day | SUBCUTANEOUS | Status: DC
  Administered 2018-10-05: 23:00:00 2 [IU] via SUBCUTANEOUS
  Administered 2018-10-06: 23:00:00 3 [IU] via SUBCUTANEOUS
  Administered 2018-10-07: 22:00:00 2 [IU] via SUBCUTANEOUS

## 2018-10-05 MED ORDER — LIP MEDEX EX OINT
TOPICAL_OINTMENT | CUTANEOUS | Status: DC | PRN
  Administered 2018-10-05: 21:00:00 via TOPICAL
  Filled 2018-10-05: qty 7

## 2018-10-05 MED ORDER — PRO-STAT SUGAR FREE PO LIQD
30.0000 mL | Freq: Two times a day (BID) | ORAL | Status: DC
  Administered 2018-10-05 – 2018-10-19 (×28): 30 mL via ORAL
  Filled 2018-10-05 (×28): qty 30

## 2018-10-05 MED ORDER — PROCHLORPERAZINE 25 MG RE SUPP: 12.5000 mg | Freq: Four times a day (QID) | RECTAL | Status: DC | PRN

## 2018-10-05 MED ORDER — ALUM & MAG HYDROXIDE-SIMETH 200-200-20 MG/5ML PO SUSP
30.0000 mL | ORAL | Status: DC | PRN
  Administered 2018-10-09 – 2018-10-10 (×2): 30 mL via ORAL
  Filled 2018-10-05 (×2): qty 30

## 2018-10-05 MED ORDER — GABAPENTIN 100 MG PO CAPS
100.0000 mg | ORAL_CAPSULE | Freq: Three times a day (TID) | ORAL | Status: DC
  Administered 2018-10-05 – 2018-10-19 (×40): 100 mg via ORAL
  Filled 2018-10-05 (×40): qty 1

## 2018-10-05 MED ORDER — GUAIFENESIN-DM 100-10 MG/5ML PO SYRP: 5.0000 mL | ORAL_SOLUTION | Freq: Four times a day (QID) | ORAL | Status: DC | PRN

## 2018-10-05 MED ORDER — FLEET ENEMA 7-19 GM/118ML RE ENEM: 1.0000 | ENEMA | Freq: Once | RECTAL | Status: DC | PRN

## 2018-10-05 MED ORDER — ENOXAPARIN SODIUM 40 MG/0.4ML ~~LOC~~ SOLN
40.0000 mg | SUBCUTANEOUS | Status: DC
  Administered 2018-10-06 – 2018-10-19 (×14): 40 mg via SUBCUTANEOUS
  Filled 2018-10-05 (×14): qty 0.4

## 2018-10-05 MED ORDER — INSULIN DETEMIR 100 UNIT/ML ~~LOC~~ SOLN
20.0000 [IU] | Freq: Two times a day (BID) | SUBCUTANEOUS | Status: DC
  Administered 2018-10-05 – 2018-10-08 (×6): 20 [IU] via SUBCUTANEOUS
  Filled 2018-10-05 (×8): qty 0.2

## 2018-10-05 MED ORDER — TRAMADOL HCL 50 MG PO TABS
50.0000 mg | ORAL_TABLET | Freq: Four times a day (QID) | ORAL | Status: DC | PRN
  Administered 2018-10-05: 50 mg via ORAL
  Filled 2018-10-05: qty 1

## 2018-10-05 MED ORDER — INSULIN ASPART 100 UNIT/ML ~~LOC~~ SOLN
0.0000 [IU] | Freq: Three times a day (TID) | SUBCUTANEOUS | Status: DC
  Administered 2018-10-06: 3 [IU] via SUBCUTANEOUS
  Administered 2018-10-06: 1 [IU] via SUBCUTANEOUS
  Administered 2018-10-06: 08:00:00 3 [IU] via SUBCUTANEOUS
  Administered 2018-10-07: 09:00:00 2 [IU] via SUBCUTANEOUS
  Administered 2018-10-07 (×2): 3 [IU] via SUBCUTANEOUS
  Administered 2018-10-08: 08:00:00 2 [IU] via SUBCUTANEOUS

## 2018-10-05 MED ORDER — PROCHLORPERAZINE MALEATE 5 MG PO TABS
5.0000 mg | ORAL_TABLET | Freq: Four times a day (QID) | ORAL | Status: DC | PRN
  Administered 2018-10-11: 10 mg via ORAL
  Filled 2018-10-05: qty 2

## 2018-10-05 MED ORDER — TRAMADOL HCL 50 MG PO TABS
50.0000 mg | ORAL_TABLET | Freq: Four times a day (QID) | ORAL | Status: DC | PRN
  Administered 2018-10-06 – 2018-10-07 (×4): 100 mg via ORAL
  Administered 2018-10-08: 22:00:00 50 mg via ORAL
  Administered 2018-10-08 – 2018-10-09 (×3): 100 mg via ORAL
  Administered 2018-10-10: 50 mg via ORAL
  Administered 2018-10-10 – 2018-10-11 (×2): 100 mg via ORAL
  Filled 2018-10-05: qty 1
  Filled 2018-10-05 (×9): qty 2

## 2018-10-05 MED ORDER — POLYSACCHARIDE IRON COMPLEX 150 MG PO CAPS
150.0000 mg | ORAL_CAPSULE | Freq: Every day | ORAL | Status: DC
  Administered 2018-10-06 – 2018-10-19 (×14): 150 mg via ORAL
  Filled 2018-10-05 (×14): qty 1

## 2018-10-05 MED ORDER — POLYETHYLENE GLYCOL 3350 17 G PO PACK
17.0000 g | PACK | Freq: Every day | ORAL | Status: DC
  Administered 2018-10-05 – 2018-10-15 (×9): 17 g via ORAL
  Filled 2018-10-05 (×13): qty 1

## 2018-10-05 MED ORDER — DIPHENHYDRAMINE HCL 12.5 MG/5ML PO ELIX
25.0000 mg | ORAL_SOLUTION | Freq: Four times a day (QID) | ORAL | Status: DC | PRN
  Administered 2018-10-05 – 2018-10-18 (×14): 25 mg via ORAL
  Filled 2018-10-05 (×14): qty 10
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
## 2018-10-05 NOTE — Progress Notes (Signed)
Patient transferred/admitted to 4M05 accompanied by RN via hospital bed. Pt alert and oriented; complained of 8/10 aching/sharp pain on L leg/incision. Oriented to room and policies. Needs attended.

## 2018-10-05 NOTE — Progress Notes (Addendum)
Inpatient Rehab Admissions:  Inpatient Rehab Consult received.  I met with patient at the bedside for rehabilitation assessment and to discuss goals and expectations of an inpatient rehab admission.  She is very interested in an inpatient rehab admission and has good family support for discharge.  I will contact her husband to confirm 24/7 support and await medical readiness for possible rehab admission tomorrow or Monday.   Addendum: I have spoken with rehab MD and ortho PA and pt is medically ready for admission to rehab today.  I will alert pt, family, RN, and CM.   Signed: Shann Medal, PT, DPT Admissions Coordinator [phone] 10/05/18  12:24 PM
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
## 2018-10-06 ENCOUNTER — Inpatient Hospital Stay (HOSPITAL_COMMUNITY): Payer: Self-pay | Admitting: Occupational Therapy

## 2018-10-06 ENCOUNTER — Inpatient Hospital Stay (HOSPITAL_COMMUNITY): Payer: Self-pay | Admitting: Physical Therapy

## 2018-10-06 DIAGNOSIS — D62 Acute posthemorrhagic anemia: Secondary | ICD-10-CM

## 2018-10-06 DIAGNOSIS — E1165 Type 2 diabetes mellitus with hyperglycemia: Secondary | ICD-10-CM

## 2018-10-06 DIAGNOSIS — M79605 Pain in left leg: Secondary | ICD-10-CM

## 2018-10-06 DIAGNOSIS — T1490XA Injury, unspecified, initial encounter: Secondary | ICD-10-CM

## 2018-10-06 LAB — COMPREHENSIVE METABOLIC PANEL
ALT: 14 U/L (ref 0–44)
AST: 19 U/L (ref 15–41)
Albumin: 2.8 g/dL — ABNORMAL LOW (ref 3.5–5.0)
Alkaline Phosphatase: 55 U/L (ref 38–126)
Anion gap: 11 (ref 5–15)
BUN: 10 mg/dL (ref 6–20)
CO2: 28 mmol/L (ref 22–32)
Calcium: 8.9 mg/dL (ref 8.9–10.3)
Chloride: 100 mmol/L (ref 98–111)
Creatinine, Ser: 0.94 mg/dL (ref 0.44–1.00)
GFR calc Af Amer: >60 mL/min (ref >60)
GFR calc non Af Amer: >60 mL/min (ref >60)
Glucose, Bld: 156 mg/dL — ABNORMAL HIGH (ref 70–99)
Potassium: 3.4 mmol/L — ABNORMAL LOW (ref 3.5–5.1)
Sodium: 139 mmol/L (ref 135–145)
Total Bilirubin: 0.5 mg/dL (ref 0.3–1.2)
Total Protein: 6.5 g/dL (ref 6.5–8.1)

## 2018-10-06 LAB — CBC WITH DIFFERENTIAL/PLATELET
Abs Immature Granulocytes: 0.03 10*3/uL (ref 0.00–0.07)
Basophils Absolute: 0 10*3/uL (ref 0.0–0.1)
Basophils Relative: 0 %
Eosinophils Absolute: 0.2 10*3/uL (ref 0.0–0.5)
Eosinophils Relative: 3 %
HCT: 26.7 % — ABNORMAL LOW (ref 36.0–46.0)
Hemoglobin: 8.6 g/dL — ABNORMAL LOW (ref 12.0–15.0)
Immature Granulocytes: 0 %
Lymphocytes Relative: 37 %
Lymphs Abs: 3 10*3/uL (ref 0.7–4.0)
MCH: 28.9 pg (ref 26.0–34.0)
MCHC: 32.2 g/dL (ref 30.0–36.0)
MCV: 89.6 fL (ref 80.0–100.0)
Monocytes Absolute: 0.5 10*3/uL (ref 0.1–1.0)
Monocytes Relative: 6 %
Neutro Abs: 4.4 10*3/uL (ref 1.7–7.7)
Neutrophils Relative %: 54 %
Platelets: 287 10*3/uL (ref 150–400)
RBC: 2.98 MIL/uL — ABNORMAL LOW (ref 3.87–5.11)
RDW: 13.3 % (ref 11.5–15.5)
WBC: 8.1 10*3/uL (ref 4.0–10.5)
nRBC: 0.6 % — ABNORMAL HIGH (ref 0.0–0.2)

## 2018-10-06 LAB — GLUCOSE, CAPILLARY
Glucose-Capillary: 201 mg/dL — ABNORMAL HIGH (ref 70–99)
Glucose-Capillary: 212 mg/dL — ABNORMAL HIGH (ref 70–99)
Glucose-Capillary: 143 mg/dL — ABNORMAL HIGH (ref 70–99)
Glucose-Capillary: 264 mg/dL — ABNORMAL HIGH (ref 70–99)

## 2018-10-06 LAB — VITAMIN D 25 HYDROXY (VIT D DEFICIENCY, FRACTURES): Vit D, 25-Hydroxy: 14.3 ng/mL — ABNORMAL LOW (ref 30.0–100.0)
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
## 2018-10-06 NOTE — Progress Notes (Signed)
Physical Therapy Session Note  Patient Details  Name: Angela Booth MRN: [id number] Date of Birth: 06-26-1966  Today's Date: 10/06/2018 PT Individual Time: 1100-1155 PT Individual Time Calculation (min): 55 min   Short Term Goals: Week 1:   See PT Eval  Skilled Therapeutic Interventions/Progress Updates:    Pt received seated in w/c in room, agreeable to PT session. Pt reports pain in LLE that increases with activity, not rated but requests pain medication from RN at end of therapy session. Set pt up with LUE platform RW to attempt standing. Sit to stand with max A to PFRW x 2 reps. Pt tolerates standing about 30 sec before needing to sit due to increase in LLE pain in standing. Focus on weight-shifting onto RLE to adhere to NWBing on LLE. Slide board transfer w/c to bed with mod A to the R. Scooting towards [facility] with mod A. Sit to supine min A for LLE management. Pt left semi-reclined in bed with needs in reach, bed alarm in place.  Therapy Documentation Precautions:  Restrictions Weight Bearing Restrictions: Yes LUE Weight Bearing: Weight bearing as tolerated LLE Weight Bearing: Non weight bearing Other Position/Activity Restrictions: hinged knee brace locked in ext at night can be unlocked during day for ROM   Therapy/Group: Individual Therapy  Peter Congo, PT, DPT  10/06/2018, 12:17 PM

## 2018-10-06 NOTE — Progress Notes (Signed)
Nutrition Brief Note  RD consulted for diet education regarding DM2 vs Obesity.   RD operating remotely d/t COVID precautions.   RD was able to reach pt by phone, however she sounded drowsy/lethargic. RD asked if this was a good time and she said it was not as she had just received prn pain medication. She was agreeable to RD following up early next week when appropriate.   Will leave c/s in place for now.   Christophe Louis RD, LDN, CNSC Clinical Nutrition Available Tues-Sat via Pager: [phone] 10/06/2018 7:50 PM

## 2018-10-06 NOTE — Progress Notes (Signed)
Angela Booth is a 53 y.o. female 1966/03/25 [id number]  Subjective: No new complaints, except for left leg pain. No new problems. Slept well. Feeling OK.  Objective: Vital signs in last 24 hours: Temp:  [97.9 F (36.6 C)-98.4 F (36.9 C)] 97.9 F (36.6 C) (04/11 0641) Pulse Rate:  [82-91] 88 (04/11 0641) Resp:  [16-18] 18 (04/11 0641) BP: (102-136)/(60-71) 102/60 (04/11 0641) SpO2:  [96 %-99 %] 98 % (04/11 0641) Weight:  [101.3 kg] 101.3 kg (04/10 2213) Weight change:  Last BM Date: 10/06/18  Intake/Output from previous day: 04/10 0701 - 04/11 0700 In: 240 [P.O.:240] Out: -  Last cbgs: CBG (last 3)  Recent Labs    10/05/18 2206 10/06/18 0744 10/06/18 1143  GLUCAP 210* 201* 212*     Physical Exam General: No apparent distress.  Obese HEENT: not dry Lungs: Normal effort. Lungs clear to auscultation, no crackles or wheezes. Cardiovascular: Regular rate and rhythm, no edema Abdomen: S/NT/ND; BS(+) Musculoskeletal:  unchanged Neurological: No new neurological deficits Wounds: Clean    Skin: clear Mental state: Alert, oriented, cooperative    Lab Results: BMET    Component Value Date/Time   NA 139 10/06/2018 0558   K 3.4 (L) 10/06/2018 0558   CL 100 10/06/2018 0558   CO2 28 10/06/2018 0558   GLUCOSE 156 (H) 10/06/2018 0558   BUN 10 10/06/2018 0558   CREATININE 0.94 10/06/2018 0558   CALCIUM 8.9 10/06/2018 0558   CALCIUM 8.8 10/03/2018 0419   GFRNONAA >60 10/06/2018 0558   GFRAA >60 10/06/2018 0558   CBC    Component Value Date/Time   WBC 8.1 10/06/2018 0558   RBC 2.98 (L) 10/06/2018 0558   HGB 8.6 (L) 10/06/2018 0558   HCT 26.7 (L) 10/06/2018 0558   PLT 287 10/06/2018 0558   MCV 89.6 10/06/2018 0558   MCH 28.9 10/06/2018 0558   MCHC 32.2 10/06/2018 0558   RDW 13.3 10/06/2018 0558   LYMPHSABS 3.0 10/06/2018 0558   MONOABS 0.5 10/06/2018 0558   EOSABS 0.2 10/06/2018 0558   BASOSABS 0.0 10/06/2018 0558    Studies/Results: No results  found.  Medications: I have reviewed the patient's current medications.  Assessment/Plan:   1.  Status post multi Ortho surgeries.  Deficits with mobility, transfers, endurance, self-care.  CIR 2.  DVT prophylaxis with Lovenox 3.  Pain management with the oral morphine as needed 4.  Emotional support 5.  Skin care/wound care 6.  Acute blood loss anemia.  Iron supplement added.  CBC 7.  Type 2 diabetes with elevated hemoglobin A1c of 10.0%.  Increase Levemir to 25 units twice daily.  Sliding scale insulin 8.  Morbid obesity.  Dietary consultation     Length of stay, days: 1  Sonda Primes , MD 10/06/2018, 12:24 PM
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
## 2018-10-07 LAB — GLUCOSE, CAPILLARY
Glucose-Capillary: 166 mg/dL — ABNORMAL HIGH (ref 70–99)
Glucose-Capillary: 217 mg/dL — ABNORMAL HIGH (ref 70–99)
Glucose-Capillary: 217 mg/dL — ABNORMAL HIGH (ref 70–99)
Glucose-Capillary: 233 mg/dL — ABNORMAL HIGH (ref 70–99)

## 2018-10-07 NOTE — Progress Notes (Signed)
Angela Booth is a 53 y.o. female 10-Jun-1966 [id number]  Subjective: Tired from yesterday.  Resting. No new problems. Slept well. Feeling OK.  Objective: Vital signs in last 24 hours: Temp:  [97.4 F (36.3 C)-97.8 F (36.6 C)] 97.7 F (36.5 C) (04/12 0445) Pulse Rate:  [79-92] 79 (04/12 0445) Resp:  [18-19] 18 (04/12 0445) BP: (116-119)/(57-70) 119/70 (04/12 0445) SpO2:  [97 %-100 %] 97 % (04/12 0445) Weight:  [103.3 kg] 103.3 kg (04/12 0445) Weight change: 2 kg Last BM Date: 10/06/18  Intake/Output from previous day: 04/11 0701 - 04/12 0700 In: 580 [P.O.:580] Out: 150 [Urine:150] Last cbgs: CBG (last 3)  Recent Labs    10/06/18 2204 10/07/18 0619 10/07/18 1204  GLUCAP 264* 166* 217*     Physical Exam General: No apparent distress   HEENT: not dry Lungs: Normal effort. Lungs clear to auscultation, no crackles or wheezes. Cardiovascular: Regular rate and rhythm, no edema Abdomen: S/NT/ND; BS(+) Musculoskeletal:  unchanged Neurological: No new neurological deficits Wounds: Clean Skin: clear otherwise Mental state: Alert, oriented, cooperative    Lab Results: BMET    Component Value Date/Time   NA 139 10/06/2018 0558   K 3.4 (L) 10/06/2018 0558   CL 100 10/06/2018 0558   CO2 28 10/06/2018 0558   GLUCOSE 156 (H) 10/06/2018 0558   BUN 10 10/06/2018 0558   CREATININE 0.94 10/06/2018 0558   CALCIUM 8.9 10/06/2018 0558   CALCIUM 8.8 10/03/2018 0419   GFRNONAA >60 10/06/2018 0558   GFRAA >60 10/06/2018 0558   CBC    Component Value Date/Time   WBC 8.1 10/06/2018 0558   RBC 2.98 (L) 10/06/2018 0558   HGB 8.6 (L) 10/06/2018 0558   HCT 26.7 (L) 10/06/2018 0558   PLT 287 10/06/2018 0558   MCV 89.6 10/06/2018 0558   MCH 28.9 10/06/2018 0558   MCHC 32.2 10/06/2018 0558   RDW 13.3 10/06/2018 0558   LYMPHSABS 3.0 10/06/2018 0558   MONOABS 0.5 10/06/2018 0558   EOSABS 0.2 10/06/2018 0558   BASOSABS 0.0 10/06/2018 0558    Studies/Results: No results  found.  Assessment/Plan:   1.  Status post [place] Ortho surgeries.  Deficits with mobility, transfers, endurance, self-care.  Continue CIR 2.  DVT prophylaxis with Lovenox 3.  Pain management with oral morphine as needed 4.  Emotional support 5.  Skin care/wound care 6.  Acute blood loss anemia.  On iron p.o.  Monitor CBC 7.  Type 2 diabetes with elevated hemoglobin A1c of 10.0%.  Increased Levemir to 25 units twice a day.  Sliding scale NovoLog 8.  Morbid obesity.  Dietary consultation     Length of stay, days: 2  Sonda Primes , MD 10/07/2018, 1:40 PM

## 2018-10-08 ENCOUNTER — Inpatient Hospital Stay (HOSPITAL_COMMUNITY): Payer: Self-pay

## 2018-10-08 ENCOUNTER — Inpatient Hospital Stay (HOSPITAL_COMMUNITY): Payer: Self-pay | Admitting: Occupational Therapy

## 2018-10-08 DIAGNOSIS — E669 Obesity, unspecified: Secondary | ICD-10-CM

## 2018-10-08 DIAGNOSIS — E876 Hypokalemia: Secondary | ICD-10-CM

## 2018-10-08 DIAGNOSIS — T07XXXA Unspecified multiple injuries, initial encounter: Secondary | ICD-10-CM

## 2018-10-08 DIAGNOSIS — E1169 Type 2 diabetes mellitus with other specified complication: Secondary | ICD-10-CM

## 2018-10-08 DIAGNOSIS — G8918 Other acute postprocedural pain: Secondary | ICD-10-CM

## 2018-10-08 LAB — GLUCOSE, CAPILLARY
Glucose-Capillary: 156 mg/dL — ABNORMAL HIGH (ref 70–99)
Glucose-Capillary: 259 mg/dL — ABNORMAL HIGH (ref 70–99)
Glucose-Capillary: 128 mg/dL — ABNORMAL HIGH (ref 70–99)
Glucose-Capillary: 223 mg/dL — ABNORMAL HIGH (ref 70–99)

## 2018-10-08 MED ORDER — POTASSIUM CHLORIDE CRYS ER 20 MEQ PO TBCR
30.0000 meq | EXTENDED_RELEASE_TABLET | Freq: Two times a day (BID) | ORAL | Status: AC
  Administered 2018-10-08 (×2): 30 meq via ORAL
  Filled 2018-10-08 (×2): qty 1

## 2018-10-08 MED ORDER — INSULIN ASPART 100 UNIT/ML ~~LOC~~ SOLN
0.0000 [IU] | Freq: Three times a day (TID) | SUBCUTANEOUS | Status: DC
  Administered 2018-10-08: 18:00:00 3 [IU] via SUBCUTANEOUS
  Administered 2018-10-08: 11 [IU] via SUBCUTANEOUS
  Administered 2018-10-09: 4 [IU] via SUBCUTANEOUS
  Administered 2018-10-09: 19:00:00 7 [IU] via SUBCUTANEOUS
  Administered 2018-10-09: 4 [IU] via SUBCUTANEOUS
  Administered 2018-10-10: 18:00:00 7 [IU] via SUBCUTANEOUS
  Administered 2018-10-10: 4 [IU] via SUBCUTANEOUS
  Administered 2018-10-10: 11 [IU] via SUBCUTANEOUS
  Administered 2018-10-11: 4 [IU] via SUBCUTANEOUS
  Administered 2018-10-11: 7 [IU] via SUBCUTANEOUS
  Administered 2018-10-11 – 2018-10-12 (×3): 4 [IU] via SUBCUTANEOUS
  Administered 2018-10-12: 11 [IU] via SUBCUTANEOUS
  Administered 2018-10-13: 19:00:00 4 [IU] via SUBCUTANEOUS
  Administered 2018-10-13: 11 [IU] via SUBCUTANEOUS
  Administered 2018-10-13 – 2018-10-14 (×3): 4 [IU] via SUBCUTANEOUS
  Administered 2018-10-14: 3 [IU] via SUBCUTANEOUS
  Administered 2018-10-15: 4 [IU] via SUBCUTANEOUS
  Administered 2018-10-15: 7 [IU] via SUBCUTANEOUS
  Administered 2018-10-15 – 2018-10-16 (×2): 4 [IU] via SUBCUTANEOUS
  Administered 2018-10-16: 3 [IU] via SUBCUTANEOUS
  Administered 2018-10-16: 4 [IU] via SUBCUTANEOUS
  Administered 2018-10-17: 13:00:00 7 [IU] via SUBCUTANEOUS
  Administered 2018-10-17 – 2018-10-18 (×5): 3 [IU] via SUBCUTANEOUS
  Administered 2018-10-19 (×2): 4 [IU] via SUBCUTANEOUS

## 2018-10-08 MED ORDER — INSULIN ASPART 100 UNIT/ML ~~LOC~~ SOLN
0.0000 [IU] | Freq: Every day | SUBCUTANEOUS | Status: DC
  Administered 2018-10-08: 2 [IU] via SUBCUTANEOUS
  Administered 2018-10-13: 3 [IU] via SUBCUTANEOUS
  Administered 2018-10-14 – 2018-10-17 (×4): 2 [IU] via SUBCUTANEOUS

## 2018-10-08 MED ORDER — INSULIN DETEMIR 100 UNIT/ML ~~LOC~~ SOLN
23.0000 [IU] | Freq: Two times a day (BID) | SUBCUTANEOUS | Status: DC
  Administered 2018-10-08 – 2018-10-10 (×4): 23 [IU] via SUBCUTANEOUS
  Filled 2018-10-08 (×6): qty 0.23

## 2018-10-08 NOTE — Progress Notes (Signed)
Patient information reviewed and entered into eRehab System by Becky Anhar Mcdermott, PPS coordinator. Information including medical coding, function ability, and quality indicators will be reviewed and updated through discharge.   

## 2018-10-08 NOTE — Care Management Note (Signed)
[facility] Individual Statement of Services  Patient Name:  Angela Booth  Date:  10/08/2018  Welcome to the [facility].  Our goal is to provide you with an individualized program based on your diagnosis and situation, designed to meet your specific needs.  With this comprehensive rehabilitation program, you will be expected to participate in at least 3 hours of rehabilitation therapies Monday-Friday, with modified therapy programming on the weekends.  Your rehabilitation program will include the following services:  Physical Therapy (PT), Occupational Therapy (OT), 24 hour per day rehabilitation nursing, Neuropsychology, Case Management (Social Worker), Rehabilitation Medicine, Nutrition Services and Pharmacy Services  Weekly team conferences will be held on Wednesday to discuss your progress.  Your Social Worker will talk with you frequently to get your input and to update you on team discussions.  Team conferences with you and your family in attendance may also be held.  Expected length of stay: 12-14 days  Overall anticipated outcome: min assist level  Depending on your progress and recovery, your program may change. Your Social Worker will coordinate services and will keep you informed of any changes. Your Social Worker's name and contact numbers are listed  below.  The following services may also be recommended but are not provided by the [facility]:   Driving Evaluations  Home Health Rehabiltiation Services  Outpatient Rehabilitation Services    Arrangements will be made to provide these services after discharge if needed.  Arrangements include referral to agencies that provide these services.  Your insurance has been verified to be:  Med Pay Your primary doctor is:  Evern Core  Pertinent information will be shared with your doctor and your insurance company.  Social Worker:  Dossie Der, SW [phone] or [phone]  Information discussed with and copy given to patient by: Lucy Chris, 10/08/2018, 9:36 AM

## 2018-10-08 NOTE — Progress Notes (Signed)
[facility] PHYSICAL MEDICINE & REHABILITATION PROGRESS NOTE  Subjective/Complaints: Patient seen laying in bed this AM.  She states she slept well overnight.  She states she had a good weekend.  Weekend notes reviewed.    ROS: Denies CP, SOB, N/V/D  Objective: Vital Signs: Blood pressure (!) 107/52, pulse 64, temperature 98.7 F (37.1 C), temperature source Oral, resp. rate 20, height 5\' 3"  (1.6 m), weight 103.3 kg, SpO2 97 %. No results found. Recent Labs    10/05/18 1355 10/06/18 0558  WBC 9.5 8.1  HGB 8.4* 8.6*  HCT 26.5* 26.7*  PLT 268 287   Recent Labs    10/05/18 1355 10/06/18 0558  NA 138 139  K 3.5 3.4*  CL 102 100  CO2 26 28  GLUCOSE 250* 156*  BUN <5* 10  CREATININE 0.76 0.94  CALCIUM 8.6* 8.9    Physical Exam: BP (!) 107/52 (BP Location: Right Arm)   Pulse 64   Temp 98.7 F (37.1 C) (Oral)   Resp 20   Ht 5\' 3"  (1.6 m)   Wt 103.3 kg   SpO2 97%   BMI 40.34 kg/m  Constitutional: No distress . Vital signs reviewed. Obese HENT: Normocephalic.  Atraumatic. Eyes: EOMI. No discharge. Cardiovascular: No JVD. Respiratory: Normal effort. GI: Non-distended. Musc: Left sided edema and tenderness Neurological: She is alert and oriented Motor: RUE/RLE: 5/5 proximal to distal LUE: Shoulder abduction, elbow flex/ext 2/5, ahnd grip 3+/5  LLE: HF 2-/5, knee in brace, ADF 4+5  Sensation intact to light touch Skin:  Scattered abrasions Left arm incision c/d/i  Psychiatric: She has a normal mood and affect. Her behavior is normal.   Assessment/Plan: 1. Functional deficits secondary to multi-ortho which require 3+ hours per day of interdisciplinary therapy in a comprehensive inpatient rehab setting.  Physiatrist is providing close team supervision and 24 hour management of active medical problems listed below.  Physiatrist and rehab team continue to assess barriers to discharge/monitor patient progress toward functional and medical goals  Care  Tool:  Bathing    Body parts bathed by patient: Front perineal area   Body parts bathed by helper: Buttocks, Right lower leg Body parts n/a: Left lower leg   Bathing assist Assist Level: Moderate Assistance - Patient 50 - 74%     Upper Body Dressing/Undressing Upper body dressing   What is the patient wearing?: Pull over shirt    Upper body assist Assist Level: Contact Guard/Touching assist    Lower Body Dressing/Undressing Lower body dressing      What is the patient wearing?: Pants     Lower body assist Assist for lower body dressing: Contact Guard/Touching assist     Toileting Toileting Toileting Activity did not occur (Clothing management and hygiene only): Safety/medical concerns  Toileting assist Assist for toileting: Moderate Assistance - Patient 50 - 74%     Transfers Chair/bed transfer  Transfers assist     Chair/bed transfer assist level: Maximal Assistance - Patient 25 - 49%     Locomotion Ambulation   Ambulation assist      Assist level: Maximal Assistance - Patient 25 - 49% Assistive device: Parallel bars Max distance: 2   Walk 10 feet activity   Assist  Walk 10 feet activity did not occur: Safety/medical concerns        Walk 50 feet activity   Assist Walk 50 feet with 2 turns activity did not occur: Safety/medical concerns         Walk 150 feet activity  Assist Walk 150 feet activity did not occur: Safety/medical concerns         Walk 10 feet on uneven surface  activity   Assist Walk 10 feet on uneven surfaces activity did not occur: Safety/medical concerns         Wheelchair     Assist   Type of Wheelchair: Manual    Wheelchair assist level: Moderate Assistance - Patient 50 - 74% Max wheelchair distance: 25    Wheelchair 50 feet with 2 turns activity    Assist    Wheelchair 50 feet with 2 turns activity did not occur: Refused       Wheelchair 150 feet activity     Assist Wheelchair  150 feet activity did not occur: Safety/medical concerns          Medical Problem List and Plan: 1.  Deficits with mobility, transfers, endurance, self-care secondary to multi-Ortho.  Cont CIR 2.  Antithrombotics: -DVT/anticoagulation:  Pharmaceutical: Lovenox             -antiplatelet therapy: N/A 3. Pain Management: MSIR PRN for severe pain             Relatively controlled on 4/13 4. Mood: LCSW to follow for evaluation and support             -antipsychotic agents: N/A  5. Neuropsych: This patient is capable of making decisions on her own behalf. 6. Skin/Wound Care: Monitor wound daily for healing.  Added protein supplements due to low calorie malnutrition.  7. Fluids/Electrolytes/Nutrition: Routine I/Os.  8. ABLA: Added iron supplement.   Hemoglobin 8.6 on 4/11  Continue to monitor 9.T2DM poorly controlled: Hgb A1c- 10.0--question compliance with insulin.   Increased Levemir to 23 units twice daily on 4/13.   SSI for elevated BS.    Monitor with increased mobility.  10.  Morbid obesity: Educated patient on weight loss to help promote health as well as mobility.    Consulted dietitian for dietary education 11.  Hypokalemia  Potassium 3.4 on 4/11  Supplemented x1 day  Continue to monitor  LOS: 3 days A FACE TO FACE EVALUATION WAS PERFORMED  Ankit Karis Jubanil Patel 10/08/2018, 9:04 AM

## 2018-10-08 NOTE — Progress Notes (Signed)
Occupational Therapy Session Note  Patient Details  Name: Angela Booth MRN: [id number] Date of Birth: 08/23/1965  Today's Date: 10/08/2018 OT Individual Time: 1000-1100 OT Individual Time Calculation (min): 60 min    Short Term Goals: Week 1:  OT Short Term Goal 1 (Week 1): Pt will don LB clothing items with mod A in order to decrease level of A with self care. OT Short Term Goal 2 (Week 1): Pt will perform slide board transfer to drop arm commode chair mod A in order to decrease level of A with functional transfers.  OT Short Term Goal 3 (Week 1): Pt will demonstrate exercises to decrease edema in L UE with min cuing for technique.   Skilled Therapeutic Interventions/Progress Updates:    Pt received in w/c and had just completed a challenging session with PT.  Pt needed time to rest.  Obtained a wide drop arm BSC for pt to use with a slide board during that time.  Pt began her self care session for bathing UB with min A and dressing UB with min A needing extra time due to pain lifting L arm.  Pt worked on [employer] transfer to bed with mod A initially and then +2 for safety as she had to slide on bed pad on top of board as she did not have pants on yet.   Once in bed, pt adjusted with pillows. Due to session time ending, pt's NT agreed to A pt with LB self care.   Pt in bed with NT in room.  Therapy Documentation Precautions:  Precautions Precautions: Fall Required Braces or Orthoses: Other Brace Other Brace: hinged knee brace on L LE  Restrictions Weight Bearing Restrictions: Yes LUE Weight Bearing: Weight bearing as tolerated LLE Weight Bearing: Non weight bearing Other Position/Activity Restrictions: hinged knee brace locked in ext at night can be unlocked during day for ROM  Pain:  Pt c/o pain with mobility, ok at rest         Therapy/Group: Individual Therapy  Leena Tiede 10/08/2018, 12:39 PM

## 2018-10-08 NOTE — Progress Notes (Signed)
Occupational Therapy Session Note  Patient Details  Name: Angela Booth MRN: [id number] Date of Birth: August 25, 1965  Today's Date: 10/08/2018 OT Individual Time: 1694-5038 OT Individual Time Calculation (min): 28 min  and Today's Date: 10/08/2018 OT Missed Time: 30 Minutes Missed Time Reason: Other (comment)(eating lunch)   Short Term Goals: Week 1:  OT Short Term Goal 1 (Week 1): Pt will don LB clothing items with mod A in order to decrease level of A with self care. OT Short Term Goal 2 (Week 1): Pt will perform slide board transfer to drop arm commode chair mod A in order to decrease level of A with functional transfers.  OT Short Term Goal 3 (Week 1): Pt will demonstrate exercises to decrease edema in L UE with min cuing for technique.   Skilled Therapeutic Interventions/Progress Updates:    Pt resting in bed upon arrival.  Lunch tray delivered at beginning of session and pt missed 30 mins skilled OT services to allow pt to eat lunch.  OT intervention with focus on discharge planning, review of LTGs, DME needs and recommendations.  Discussed scheduling rest breaks during therapy day to facilitate improved ability to participate in therapies. Pt remained in bed with all needs within reach and bed alarm activated.   Therapy Documentation Precautions:  Precautions Precautions: Fall Required Braces or Orthoses: Other Brace Other Brace: hinged knee brace on L LE  Restrictions Weight Bearing Restrictions: Yes LUE Weight Bearing: Weight bearing as tolerated LLE Weight Bearing: Non weight bearing Other Position/Activity Restrictions: hinged knee brace locked in ext at night can be unlocked during day for ROM General: General OT Amount of Missed Time: 30 Minutes Pain:  Pt c/o 5/10 pain primarily in LLE and aching in LUE; meds admin prior to therapy; repositioned   Therapy/Group: Individual Therapy  Rich Brave 10/08/2018, 1:29 PM

## 2018-10-08 NOTE — Progress Notes (Signed)
Physical Therapy Session Note  Patient Details  Name: Angela Booth MRN: [id number] Date of Birth: 02-08-1966  Today's Date: 10/08/2018 PT Individual Time: 1445-1515 PT Individual Time Calculation (min): 30 min   Short Term Goals: Week 1:  PT Short Term Goal 1 (Week 1): Pt will perform bed mobility with mod assist  PT Short Term Goal 2 (Week 1): Pt will performed wc<>bed transfer with mod assist consistently  PT Short Term Goal 3 (Week 1): Pt will perform sit<>stand with max assist at RW  PT Short Term Goal 4 (Week 1): Pt will prollel WC 183ft with min assist   Skilled Therapeutic Interventions/Progress Updates:     Patient in bed upon PT arrival. Patient alert and agreeable to PT session. Bledsoe brace applied to L LE throughout session.   Therapeutic Activity: Bed Mobility: Patient performed supine to sit with mod A with HOB elevated and sit to supine with max A for LE management and trunk support. Performed scooting up in bed x2 with mod A. Provided verbal cues for use of UE's to sit up and scooting in bed with R LE and B UE's. Transfers: Patient performed stand pivot transfer to/from the [facility] with mod A using a L platform RW. Provided verbal cues for hand placement, L LE NWB, and pivot technique on R foot. Patient voided on [facility] and was supervision for safety with peri-care.  Patient in bed at end of session with breaks locked, bed alarm set, L LE elevated on 2 pillows, and all needs within reach.    Therapy Documentation Precautions:  Precautions Precautions: Fall Required Braces or Orthoses: Other Brace Other Brace: hinged knee brace on L LE  Restrictions Weight Bearing Restrictions: Yes LUE Weight Bearing: Weight bearing as tolerated LLE Weight Bearing: Non weight bearing Other Position/Activity Restrictions: hinged knee brace locked in ext at night can be unlocked during day for ROM Vital Signs: Therapy Vitals Temp: 97.8 F (36.6 C) Temp Source: Oral Pulse Rate:  82 Resp: 16 BP: 132/65 Patient Position (if appropriate): Lying Oxygen Therapy SpO2: 99 % O2 Device: Room Air Pain: Pain Assessment Pain Scale: 0-10 Pain Score: 4 Pain intervention: repositioned, distracted    Therapy/Group: Individual Therapy  Helayne Seminole, PT, DPT 10/08/2018, 4:35 PM
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
## 2018-10-08 NOTE — Plan of Care (Signed)
Nutrition Education Note  RD consulted for nutrition education regarding diabetes and obesity.  Pt reports having received prior education including handouts from her PCP office and reports she is familiar with how she should be eating for her diabetes diagnosis. Pt reports that she stays away from sugar-sweetened beverages (may have apple juice once in a while) and eats fruits and vegetables to maximize fiber intake.  Lab Results  Component Value Date   HGBA1C 10.0 (H) 10/02/2018    RD provided "Carbohydrate Counting for People with Diabetes" handout from the Academy of Nutrition and Dietetics. Discussed different food groups and their effects on blood sugar, emphasizing carbohydrate-containing foods. Provided list of carbohydrates and recommended serving sizes of common foods.  Discussed importance of controlled and consistent carbohydrate intake throughout the day. Provided examples of ways to balance meals/snacks and encouraged intake of high-fiber, whole grain complex carbohydrates. Teach back method used.  Expect good compliance.  Body mass index is 40.34 kg/m. Pt meets criteria for obesity class III based on current BMI.  Current diet order is Carb Modified, patient is consuming approximately 75% of meals at this time. Labs and medications reviewed. No further nutrition interventions warranted at this time. RD contact information provided. If additional nutrition issues arise, please re-consult RD.   Earma Reading, MS, RD, LDN Inpatient Clinical Dietitian Pager: [phone] Weekend/After Hours: [phone]

## 2018-10-08 NOTE — Progress Notes (Addendum)
Physical Therapy Session Note  Patient Details  Name: Angela Booth MRN: [id number] Date of Birth: August 11, 1965  Today's Date: 10/08/2018 PT Individual Time: 0900-1010 PT Individual Time Calculation (min): 70 min   Short Term Goals: Week 1:  PT Short Term Goal 1 (Week 1): Pt will perform bed mobility with mod assist  PT Short Term Goal 2 (Week 1): Pt will performed wc<>bed transfer with mod assist consistently  PT Short Term Goal 3 (Week 1): Pt will perform sit<>stand with max assist at RW  PT Short Term Goal 4 (Week 1): Pt will prollel WC 162ft with min assist   Skilled Therapeutic Interventions/Progress Updates:  Pt resting in bed, with locked knee brace donned.  She was able to state wt bearing restrictions accurately.  Therapeutic exercise performed with LE to increase strength for functional mobility:  Supine- 10 x 1 bil hip internal rotation to neutral, 25 x 1 alternating ankle pumps, 15 x 2 cervical flexion. AROM L knee with brace unlocked, to tolerance.  10 x 1 Short arc quad knee ext.  PT instructed pt in using incentive inspirometer, x 10, with cues to slow inhalations. PT instructed pt in fit of L knee brace, and repositioned it as it had slid down on LE  Supine> sit with mod assist.  Slide board transfer with stool under L foot, max assist.  Toilet transfer using PFRW +2 with cues for NWBing LLE .  Pt voided on toilet.  Pt stated L knee pain 9/10.  PT consulted Ed, RN about a dsg for L forearm during therapy. He will check with PA.  Pt left sitting in wc with [name], OT in attendance.     Therapy Documentation Precautions:  Precautions Precautions: Fall Required Braces or Orthoses: Other Brace Other Brace: hinged knee brace on L LE  Restrictions Weight Bearing Restrictions: Yes LUE Weight Bearing: Weight bearing as tolerated LLE Weight Bearing: Non weight bearing Other Position/Activity Restrictions: hinged knee brace locked in ext at night can be unlocked during day  for ROM   Vital Signs:  Pain: rated 5/10 L knee; premedicated       Therapy/Group: Individual Therapy  Krithi Bray 10/08/2018, 10:16 AM

## 2018-10-09 ENCOUNTER — Inpatient Hospital Stay (HOSPITAL_COMMUNITY): Payer: Self-pay

## 2018-10-09 ENCOUNTER — Inpatient Hospital Stay (HOSPITAL_COMMUNITY): Payer: Self-pay | Admitting: Occupational Therapy

## 2018-10-09 DIAGNOSIS — R7309 Other abnormal glucose: Secondary | ICD-10-CM

## 2018-10-09 LAB — GLUCOSE, CAPILLARY
Glucose-Capillary: 176 mg/dL — ABNORMAL HIGH (ref 70–99)
Glucose-Capillary: 173 mg/dL — ABNORMAL HIGH (ref 70–99)
Glucose-Capillary: 203 mg/dL — ABNORMAL HIGH (ref 70–99)
Glucose-Capillary: 194 mg/dL — ABNORMAL HIGH (ref 70–99)

## 2018-10-09 NOTE — Progress Notes (Signed)
Occupational Therapy Session Note  Patient Details  Name: Angela Booth MRN: [id number] Date of Birth: Dec 06, 1965  Today's Date: 10/09/2018 OT Individual Time: 1030-1140 OT Individual Time Calculation (min): 70 min    Short Term Goals: Week 1:  OT Short Term Goal 1 (Week 1): Pt will don LB clothing items with mod A in order to decrease level of A with self care. OT Short Term Goal 2 (Week 1): Pt will perform slide board transfer to drop arm commode chair mod A in order to decrease level of A with functional transfers.  OT Short Term Goal 3 (Week 1): Pt will demonstrate exercises to decrease edema in L UE with min cuing for technique.   Skilled Therapeutic Interventions/Progress Updates:    Patient seated in recliner and ready for therapy session.,  She notes that pain is related to positioning and movement at this time.  Completed SPT with platform RW - mod A to stand from recliner, min a for pivot to commode with good carryover of weight bearing precautions.  Toileting - dependent for pants up/down and hygiene after bowel movement.  Completed upperbody washing and dressing while seated on the commode, min A to wash right UE, set up for [facility] shirt. Completed SPT with platform RW commode to bed (greater distance covered with min A overall for sit to stand and walker management - max cues for walker placement)   Bed mobility :  SSP to supine with mod A to manage left LE, patient able to move herself up in bed using right side rails and head board.  LB washing completed in bed requiring max A due to limited reach and L LE brace.  She declined putting pants back on at this time for comfort.  She requires max A to doff/don slipper socks.  Patient requested to remain in bed for a rest break before next therapy session,  Bed alarm set and items within reach.    Therapy Documentation Precautions:  Precautions Precautions: Fall Required Braces or Orthoses: Other Brace Other Brace: hinged knee brace on L  LE  Restrictions Weight Bearing Restrictions: Yes LUE Weight Bearing: Weight bearing as tolerated LLE Weight Bearing: Non weight bearing Other Position/Activity Restrictions: hinged knee brace locked in ext at night can be unlocked during day for ROM General:   Vital Signs:  Pain: Pain Assessment Pain Scale: 0-10 Pain Score: 3    Other Treatments:     Therapy/Group: Individual Therapy  Barrie Lyme 10/09/2018, 11:47 AM

## 2018-10-09 NOTE — Progress Notes (Signed)
Occupational Therapy Session Note  Patient Details  Name: Angela Booth MRN: [id number] Date of Birth: 1965/11/06  Today's Date: 10/09/2018 OT Individual Time: 8115-7262 OT Individual Time Calculation (min): 55 min    Short Term Goals: Week 1:  OT Short Term Goal 1 (Week 1): Pt will don LB clothing items with mod A in order to decrease level of A with self care. OT Short Term Goal 2 (Week 1): Pt will perform slide board transfer to drop arm commode chair mod A in order to decrease level of A with functional transfers.  OT Short Term Goal 3 (Week 1): Pt will demonstrate exercises to decrease edema in L UE with min cuing for technique.   Skilled Therapeutic Interventions/Progress Updates:    Pt resting in bed upon arrival.  OT intervention with focus on LUE PROM/AAROM/AROM to increase functional use for BADLs.  Pt with considerable bruising on dorsal aspect of forearm and upper arm.  Considerable edema noted constricting full ROM at elbow.  Kinesio tape applied to upper arm for edema management. Pt instructed on stretching and AROM exercises to increase ROM and reduce edema. Pt return demonstrated exercises/stretching. Pt remained in bed with all needs within reach and bed alarm activated.   Therapy Documentation Precautions:  Precautions Precautions: Fall Required Braces or Orthoses: Other Brace Other Brace: hinged knee brace on L LE  Restrictions Weight Bearing Restrictions: Yes LUE Weight Bearing: Weight bearing as tolerated LLE Weight Bearing: Non weight bearing Other Position/Activity Restrictions: hinged knee brace locked in ext at night can be unlocked during day for ROM   Pain: Pt c/o 4/10 pain in LUE/shoulder with movement; kinesio tape applied for edema management  Therapy/Group: Individual Therapy  Rich Brave 10/09/2018, 1:27 PM

## 2018-10-09 NOTE — Progress Notes (Signed)
[place] PHYSICAL MEDICINE & REHABILITATION PROGRESS NOTE  Subjective/Complaints: Patient seen laying in bed this morning.  She states he slept well overnight.  She is questions regarding discharge date.  ROS: Denies CP, SOB, N/V/D  Objective: Vital Signs: Blood pressure (!) 134/54, pulse 76, temperature 98.1 F (36.7 C), temperature source Oral, resp. rate 17, height 5\' 3"  (1.6 m), weight 103.3 kg, SpO2 96 %. No results found. No results for input(s): WBC, HGB, HCT, PLT in the last 72 hours. No results for input(s): NA, K, CL, CO2, GLUCOSE, BUN, CREATININE, CALCIUM in the last 72 hours.  Physical Exam: BP (!) 134/54 (BP Location: Left Arm)   Pulse 76   Temp 98.1 F (36.7 C) (Oral)   Resp 17   Ht 5\' 3"  (1.6 m)   Wt 103.3 kg   SpO2 96%   BMI 40.34 kg/m  Constitutional: No distress . Vital signs reviewed. Obese HENT: Normocephalic.  Atraumatic. Eyes: EOMI.  No discharge. Cardiovascular: No JVD. Respiratory: Normal effort. GI: Non-distended. Musc: Left sided edema and tenderness Neurological: She is alert and oriented Motor:  LUE: Shoulder abduction, elbow flex/ext 2/5, ahnd grip 3+/5, stable LLE: HF 2+/5, knee in brace, ADF 4+5  Sensation intact to light touch Skin:  Scattered abrasions Left arm incision c/d/i  Psychiatric: She has a normal mood and affect. Her behavior is normal.   Assessment/Plan: 1. Functional deficits secondary to multi-ortho which require 3+ hours per day of interdisciplinary therapy in a comprehensive inpatient rehab setting.  Physiatrist is providing close team supervision and 24 hour management of active medical problems listed below.  Physiatrist and rehab team continue to assess barriers to discharge/monitor patient progress toward functional and medical goals  Care Tool:  Bathing    Body parts bathed by patient: Chest, Abdomen, Left arm   Body parts bathed by helper: Right arm Body parts n/a: Left lower leg   Bathing assist Assist  Level: Moderate Assistance - Patient 50 - 74%     Upper Body Dressing/Undressing Upper body dressing   What is the patient wearing?: Pull over shirt    Upper body assist Assist Level: Minimal Assistance - Patient > 75%    Lower Body Dressing/Undressing Lower body dressing      What is the patient wearing?: Pants     Lower body assist Assist for lower body dressing: Contact Guard/Touching assist     Toileting Toileting Toileting Activity did not occur (Clothing management and hygiene only): Safety/medical concerns  Toileting assist Assist for toileting: Minimal Assistance - Patient > 75%     Transfers Chair/bed transfer  Transfers assist     Chair/bed transfer assist level: Maximal Assistance - Patient 25 - 49%     Locomotion Ambulation   Ambulation assist      Assist level: Maximal Assistance - Patient 25 - 49% Assistive device: Parallel bars Max distance: 2   Walk 10 feet activity   Assist  Walk 10 feet activity did not occur: Safety/medical concerns        Walk 50 feet activity   Assist Walk 50 feet with 2 turns activity did not occur: Safety/medical concerns         Walk 150 feet activity   Assist Walk 150 feet activity did not occur: Safety/medical concerns         Walk 10 feet on uneven surface  activity   Assist Walk 10 feet on uneven surfaces activity did not occur: Safety/medical concerns  Wheelchair     Assist   Type of Wheelchair: Manual    Wheelchair assist level: Moderate Assistance - Patient 50 - 74% Max wheelchair distance: 25    Wheelchair 50 feet with 2 turns activity    Assist    Wheelchair 50 feet with 2 turns activity did not occur: Refused       Wheelchair 150 feet activity     Assist Wheelchair 150 feet activity did not occur: Safety/medical concerns          Medical Problem List and Plan: 1.  Deficits with mobility, transfers, endurance, self-care secondary to  multi-Ortho.  Cont CIR 2.  Antithrombotics: -DVT/anticoagulation:  Pharmaceutical: Lovenox             -antiplatelet therapy: N/A 3. Pain Management: MSIR PRN for severe pain             Relatively controlled on 4/14 4. Mood: LCSW to follow for evaluation and support             -antipsychotic agents: N/A  5. Neuropsych: This patient is capable of making decisions on her own behalf. 6. Skin/Wound Care: Monitor wound daily for healing.   Added protein supplements due to low calorie malnutrition.  7. Fluids/Electrolytes/Nutrition: Routine I/Os.  8. ABLA: Added iron supplement.   Hemoglobin 8.6 on 4/11  Continue to monitor 9.T2DM poorly controlled: Hgb A1c- 10.0--question compliance with insulin.   Increased Levemir to 23 units twice daily on 4/13.   SSI for elevated BS.    Monitor with increased mobility.   Labile and elevated on 4/14 10.  Morbid obesity: Educated patient on weight loss to help promote health as well as mobility.    Consulted dietitian for dietary education 11.  Hypokalemia  Potassium 3.4 on 4/11  Supplemented x1 day  Continue to monitor  LOS: 4 days A FACE TO FACE EVALUATION WAS PERFORMED  Sherly Brodbeck Karis Juba 10/09/2018, 9:12 AM

## 2018-10-09 NOTE — Progress Notes (Signed)
Physical Therapy Session Note  Patient Details  Name: Angela Booth MRN: [id number] Date of Birth: 1965/10/18  Today's Date: 10/09/2018 PT Individual Time: 8295-6213 PT Individual Time Calculation (min): 75 min   Short Term Goals: Week 1:  PT Short Term Goal 1 (Week 1): Pt will perform bed mobility with mod assist  PT Short Term Goal 2 (Week 1): Pt will performed wc<>bed transfer with mod assist consistently  PT Short Term Goal 3 (Week 1): Pt will perform sit<>stand with max assist at RW  PT Short Term Goal 4 (Week 1): Pt will prollel WC 116ft with min assist   Skilled Therapeutic Interventions/Progress Updates:    Patient in supine, agreeable to PT, requesting to use BSC.  Supine to sit max A for L LE and trunk.  Sit to stand to PFRW mod A and pt stand pivot to [facility] with mod to min A.  Seated to perform toilet hygiene and max A to don pants.  Patient performed stand pivot to w/c mod A and in w/c propelled slowly due to L UE pain with min A x 40'.  Patient in gym stand pivot with RW to mat, sit to supine mod A for L LE.  Supine for therex AAROM/AROM ankle pumps, quad sets, heel slides (AAROM 12-50 degrees L knee), educated how to use leg lifter and pt performed hip add/abduction, R leg single leg bridging all x 5-10 reps.  Patient supine to sit max A for L LE and to lift trunk (used leg lifter to assist moving L LE until off EOM).  Pivot to chair min A with RW and assisted in w/c to room, pivot to recliner with min A and left with all needs in reach.    Therapy Documentation Precautions:  Precautions Precautions: Fall Required Braces or Orthoses: Other Brace Other Brace: hinged knee brace on L LE  Restrictions Weight Bearing Restrictions: Yes LUE Weight Bearing: Weight bearing as tolerated LLE Weight Bearing: Non weight bearing Other Position/Activity Restrictions: hinged knee brace locked in ext at night can be unlocked during day for ROM Pain: Pain Assessment Pain Scale: 0-10 Pain  Score: 3  Pain Type: Acute pain Pain Location: Knee Pain Orientation: Left Pain Descriptors / Indicators: Grimacing;Guarding;Throbbing    Therapy/Group: Individual Therapy  Elray Mcgregor  Sheran Lawless, PT 10/09/2018, 12:54 PM

## 2018-10-10 ENCOUNTER — Inpatient Hospital Stay (HOSPITAL_COMMUNITY): Payer: Self-pay

## 2018-10-10 ENCOUNTER — Encounter (HOSPITAL_COMMUNITY): Payer: Self-pay

## 2018-10-10 ENCOUNTER — Inpatient Hospital Stay (HOSPITAL_COMMUNITY): Payer: Self-pay | Admitting: *Deleted

## 2018-10-10 ENCOUNTER — Inpatient Hospital Stay (HOSPITAL_COMMUNITY): Payer: Self-pay | Admitting: Occupational Therapy

## 2018-10-10 ENCOUNTER — Encounter (HOSPITAL_COMMUNITY): Payer: Self-pay | Admitting: Psychology

## 2018-10-10 LAB — GLUCOSE, CAPILLARY
Glucose-Capillary: 194 mg/dL — ABNORMAL HIGH (ref 70–99)
Glucose-Capillary: 286 mg/dL — ABNORMAL HIGH (ref 70–99)
Glucose-Capillary: 221 mg/dL — ABNORMAL HIGH (ref 70–99)
Glucose-Capillary: 158 mg/dL — ABNORMAL HIGH (ref 70–99)

## 2018-10-10 MED ORDER — INSULIN DETEMIR 100 UNIT/ML ~~LOC~~ SOLN
25.0000 [IU] | Freq: Two times a day (BID) | SUBCUTANEOUS | Status: DC
  Administered 2018-10-10 – 2018-10-11 (×2): 25 [IU] via SUBCUTANEOUS
  Filled 2018-10-10 (×4): qty 0.25

## 2018-10-10 NOTE — Progress Notes (Signed)
[place] PHYSICAL MEDICINE & REHABILITATION PROGRESS NOTE  Subjective/Complaints: Patient seen laying in bed this AM.  She states she slept well overnight.  She was questions regarding progress, wound care, edema.   ROS: Denies CP, SOB, N/V/D  Objective: Vital Signs: Blood pressure 103/67, pulse 93, temperature 98 F (36.7 C), temperature source Oral, resp. rate 16, height 5\' 3"  (1.6 m), weight 103.3 kg, SpO2 96 %. No results found. No results for input(s): WBC, HGB, HCT, PLT in the last 72 hours. No results for input(s): NA, K, CL, CO2, GLUCOSE, BUN, CREATININE, CALCIUM in the last 72 hours.  Physical Exam: BP 103/67 (BP Location: Right Arm)   Pulse 93   Temp 98 F (36.7 C) (Oral)   Resp 16   Ht 5\' 3"  (1.6 m)   Wt 103.3 kg   SpO2 96%   BMI 40.34 kg/m  Constitutional: No distress . Vital signs reviewed. Obese HENT: Normocephalic. Atraumatic. Eyes: EOMI. No discharge. Cardiovascular: No JVD. Respiratory: Normal effort. GI: Non-distended. Musc: Left sided edema and tenderness Neurological: She is alert and oriented Motor:  LUE: Shoulder abduction, elbow flex/ext 2-2+/5, hand grip 3+-4-/5 LLE: HF 2+/5, knee in brace, ADF 4+5, improving Sensation intact to light touch Skin:  Scattered abrasions Left arm incision c/d/i  Psychiatric: She has a normal mood and affect. Her behavior is normal.   Assessment/Plan: 1. Functional deficits secondary to multi-ortho which require 3+ hours per day of interdisciplinary therapy in a comprehensive inpatient rehab setting.  Physiatrist is providing close team supervision and 24 hour management of active medical problems listed below.  Physiatrist and rehab team continue to assess barriers to discharge/monitor patient progress toward functional and medical goals  Care Tool:  Bathing    Body parts bathed by patient: Left arm, Chest, Abdomen, Front perineal area, Right upper leg, Face   Body parts bathed by helper: Right arm,  Buttocks, Left upper leg, Right lower leg, Left lower leg Body parts n/a: Left lower leg   Bathing assist Assist Level: Moderate Assistance - Patient 50 - 74%     Upper Body Dressing/Undressing Upper body dressing   What is the patient wearing?: Pull over shirt    Upper body assist Assist Level: Supervision/Verbal cueing    Lower Body Dressing/Undressing Lower body dressing      What is the patient wearing?: Pants     Lower body assist Assist for lower body dressing: Contact Guard/Touching assist     Toileting Toileting Toileting Activity did not occur (Clothing management and hygiene only): Safety/medical concerns  Toileting assist Assist for toileting: Maximal Assistance - Patient 25 - 49%     Transfers Chair/bed transfer  Transfers assist     Chair/bed transfer assist level: Minimal Assistance - Patient > 75%     Locomotion Ambulation   Ambulation assist      Assist level: Maximal Assistance - Patient 25 - 49% Assistive device: Parallel bars Max distance: 2   Walk 10 feet activity   Assist  Walk 10 feet activity did not occur: Safety/medical concerns        Walk 50 feet activity   Assist Walk 50 feet with 2 turns activity did not occur: Safety/medical concerns         Walk 150 feet activity   Assist Walk 150 feet activity did not occur: Safety/medical concerns         Walk 10 feet on uneven surface  activity   Assist Walk 10 feet on uneven surfaces activity  did not occur: Safety/medical concerns         Wheelchair     Assist   Type of Wheelchair: Manual    Wheelchair assist level: Minimal Assistance - Patient > 75% Max wheelchair distance: 23'    Wheelchair 50 feet with 2 turns activity    Assist    Wheelchair 50 feet with 2 turns activity did not occur: Refused       Wheelchair 150 feet activity     Assist Wheelchair 150 feet activity did not occur: Safety/medical concerns          Medical  Problem List and Plan: 1.  Deficits with mobility, transfers, endurance, self-care secondary to multi-Ortho.  Cont CIR 2.  Antithrombotics: -DVT/anticoagulation:  Pharmaceutical: Lovenox             -antiplatelet therapy: N/A 3. Pain Management: MSIR PRN for severe pain             Relatively controlled on 4/15 4. Mood: LCSW to follow for evaluation and support             -antipsychotic agents: N/A  5. Neuropsych: This patient is capable of making decisions on her own behalf. 6. Skin/Wound Care: Monitor wound daily for healing.   Added protein supplements due to low calorie malnutrition.  7. Fluids/Electrolytes/Nutrition: Routine I/Os.  8. ABLA: Added iron supplement.   Hemoglobin 8.6 on 4/11  Continue to monitor 9.T2DM poorly controlled: Hgb A1c- 10.0--question compliance with insulin.   Increased Levemir to 23 units twice daily on 4/13, increased to 25 BID.   SSI for elevated BS.   Monitor with increased mobility.  10.  Morbid obesity: Educated patient on weight loss to help promote health as well as mobility.    Consulted dietitian for dietary education 11.  Hypokalemia  Potassium 3.4 on 4/11  Supplemented x1 day  Labs ordered for tomorrow  Continue to monitor  LOS: 5 days A FACE TO FACE EVALUATION WAS PERFORMED  Ankit Karis Juba 10/10/2018, 8:33 AM
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
## 2018-10-10 NOTE — Progress Notes (Signed)
Physical Therapy Session Note  Patient Details  Name: Angela Booth MRN: [id number] Date of Birth: 06/09/1966  Today's Date: 10/10/2018 PT Individual Time: 1435-1535 PT Individual Time Calculation (min): 60 min   Short Term Goals: Week 1:  PT Short Term Goal 1 (Week 1): Pt will perform bed mobility with mod assist  PT Short Term Goal 2 (Week 1): Pt will performed wc<>bed transfer with mod assist consistently  PT Short Term Goal 3 (Week 1): Pt will perform sit<>stand with max assist at RW  PT Short Term Goal 4 (Week 1): Pt will prollel WC 137ft with min assist   Skilled Therapeutic Interventions/Progress Updates:  Pt seated in w/c.  She stated that she needed to use toilet.  Sit> stand to PFRW with min assist.  BSC moved underneath her; LLE elevated on stool while pt on BSC.  Continent of urine.  Pt performed peri care in sitting iwht set-up.  She needed total assist for managing clothing.  Sit> stand as above so that w/c could be placed under her.    PT repositioned L knee brace with pt in sitting; it continually slides down even when snug at the thigh straps.  W/c propulsion using bil UEs and R foot, x 40' x 2 with supervision, and cues for efficiency.  Attempted gait with PFRW.  Sit to stand as above. Pt able to take 2 small steps, with max assist to avoid putting wt through LLE.   Seated Therapeutic exercise performed with LE to increase strength for functional mobility: L short arc quad knee extensions (Knee brace unlocked) x 10.  Pt stated her pain increased to 6/10 with movement.    Knee brace re-positioned again.    Pt left resting in w/c with needs at hand.      Therapy Documentation Precautions:  Precautions Precautions: Fall Required Braces or Orthoses: Other Brace Other Brace: hinged knee brace on L LE  Restrictions Weight Bearing Restrictions: Yes LUE Weight Bearing: Weight bearing as tolerated LLE Weight Bearing: Non weight bearing Other Position/Activity  Restrictions: hinged knee brace locked in ext at night can be unlocked during day for ROM  Pain: Pain Assessment Pain Scale: 0-10 Pain Score: 3, L knee, at rest, premedicated       Therapy/Group: Individual Therapy  Moniqua Engebretsen 10/10/2018, 4:41 PM
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
## 2018-10-10 NOTE — Progress Notes (Signed)
Occupational Therapy Session Note  Patient Details  Name: Angela Booth MRN: [id number] Date of Birth: 1966-04-12  Today's Date: 10/10/2018 OT Individual Time: 2458-0998 OT Individual Time Calculation (min): 55 min    Short Term Goals: Week 1:  OT Short Term Goal 1 (Week 1): Pt will don LB clothing items with mod A in order to decrease level of A with self care. OT Short Term Goal 2 (Week 1): Pt will perform slide board transfer to drop arm commode chair mod A in order to decrease level of A with functional transfers.  OT Short Term Goal 3 (Week 1): Pt will demonstrate exercises to decrease edema in L UE with min cuing for technique.   Skilled Therapeutic Interventions/Progress Updates:    OT intervention with focus on LUE functional use and increase PROM/AROM to increase independence with BADLs.  Pt performed sit<>stand from w/c with min A X 5.  Bruising and edema improved from 4/14. New Kinesio tape applied to dorsal aspect of L forearm.  Pt engaged in LUE AROM and PROM activities. Pt remained in w/c with all needs within reach.   Therapy Documentation Precautions:  Precautions Precautions: Fall Required Braces or Orthoses: Other Brace Other Brace: hinged knee brace on L LE  Restrictions Weight Bearing Restrictions: Yes LUE Weight Bearing: Weight bearing as tolerated LLE Weight Bearing: Non weight bearing Other Position/Activity Restrictions: hinged knee brace locked in ext at night can be unlocked during day for ROM   Pain: Pain Assessment Pain Scale: 0-10 Pain Score: 5  Pain Type: Surgical pain Pain Location: Knee Pain Orientation: Left Pain Descriptors / Indicators: Discomfort Pain Frequency: Intermittent Pain Intervention(s): RN Elena notified and meds admin during session  Therapy/Group: Individual Therapy  Rich Brave 10/10/2018, 1:32 PM
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
## 2018-10-10 NOTE — Progress Notes (Signed)
Occupational Therapy Session Note  Patient Details  Name: Angela Booth MRN: [id number] Date of Birth: 02-21-1966  Today's Date: 10/10/2018 OT Individual Time: 2409-7353 OT Individual Time Calculation (min): 75 min    Short Term Goals: Week 1:  OT Short Term Goal 1 (Week 1): Pt will don LB clothing items with mod A in order to decrease level of A with self care. OT Short Term Goal 2 (Week 1): Pt will perform slide board transfer to drop arm commode chair mod A in order to decrease level of A with functional transfers.  OT Short Term Goal 3 (Week 1): Pt will demonstrate exercises to decrease edema in L UE with min cuing for technique.   Skilled Therapeutic Interventions/Progress Updates:    Pt seen this session to focus on functional mobility with self care.  Pt received in bed and needed to toilet. With mod A she was able to sit to EOB then stand with platform walker. She did a great job pivoting to the [facility] with min A using PFRW.  On commode, placed towel under L thigh for protection.  Pt needed to have L leg adjusted and positioned on step bench for support. Pt was then able to bathe self with mod A and donned dress with set up.  She was able to have a BM and then needed A to cleanse bottom, pt was able to cleanse front perineal area herself.  She then completed stand pivot to wc to go to sink to complete grooming tasks.  Pt resting in w/c at end of session with all needs met.    Therapy Documentation Precautions:  Precautions Precautions: Fall Required Braces or Orthoses: Other Brace Other Brace: hinged knee brace on L LE  Restrictions Weight Bearing Restrictions: Yes LUE Weight Bearing: Weight bearing as tolerated LLE Weight Bearing: Non weight bearing Other Position/Activity Restrictions: hinged knee brace locked in ext at night can be unlocked during day for ROM    Vital Signs: Therapy Vitals Temp: 98 F (36.7 C) Temp Source: Oral Pulse Rate: 93 Resp: 16 BP: 103/67 Patient  Position (if appropriate): Lying Oxygen Therapy SpO2: 96 % O2 Device: Room Air Pain: Pain Assessment Pain Scale: 0-10 Pain Score: 5  Pain Type: Acute pain Pain Location: Knee Pain Orientation: Left Pain Descriptors / Indicators: Aching Pain Onset: On-going Pain Intervention(s): RN made aware ADL:   Therapy/Group: Individual Therapy  [place] 10/10/2018, 9:09 AM

## 2018-10-10 NOTE — Plan of Care (Signed)
  Problem: Consults Goal: RH GENERAL PATIENT EDUCATION Description See Patient Education module for education specifics. Outcome: Progressing Goal: Skin Care Protocol Initiated - if Braden Score 18 or less Description If consults are not indicated, leave blank or document N/A Outcome: Progressing Goal: Nutrition Consult-if indicated Outcome: Progressing Goal: Diabetes Guidelines if Diabetic/Glucose > 140 Description If diabetic or lab glucose is > 140 mg/dl - Initiate Diabetes/Hyperglycemia Guidelines & Document Interventions  Outcome: Progressing   Problem: RH BOWEL ELIMINATION Goal: RH STG MANAGE BOWEL WITH ASSISTANCE Description STG Manage Bowel with min Assistance.  Outcome: Progressing Flowsheets (Taken 10/10/2018 1246) STG: Pt will manage bowels with assistance: 4-Minimum assistance Goal: RH STG MANAGE BOWEL W/MEDICATION W/ASSISTANCE Description STG Manage Bowel with Medication with minAssistance.  Outcome: Progressing Flowsheets (Taken 10/10/2018 1246) STG: Pt will manage bowels with medication with assistance: 4-Minimal assistance   Problem: RH SKIN INTEGRITY Goal: RH STG SKIN FREE OF INFECTION/BREAKDOWN Description Free of skin infection or skin breakdown while in Rehab with min assist  Outcome: Progressing Goal: RH STG MAINTAIN SKIN INTEGRITY WITH ASSISTANCE Description STG Maintain Skin Integrity With min Assistance.  Outcome: Progressing Flowsheets (Taken 10/10/2018 1246) STG: Maintain skin integrity with assistance: 4-Minimal assistance Goal: RH STG ABLE TO PERFORM INCISION/WOUND CARE W/ASSISTANCE Description STG Able To Perform Incision/Wound Care With Assistance. Outcome: Progressing Flowsheets (Taken 10/10/2018 1246) STG: Pt will be able to perform incision/wound care with assistance: 4-Minimal assistance   Problem: RH SAFETY Goal: RH STG ADHERE TO SAFETY PRECAUTIONS W/ASSISTANCE/DEVICE Description STG Adhere to Safety Precautions With mod  Assistance/Device.  Outcome: Progressing Flowsheets (Taken 10/10/2018 1246) STG:Pt will adhere to safety precautions with assistance/device: 4-Minimal assistance Goal: RH STG DECREASED RISK OF FALL WITH ASSISTANCE Description STG Decreased Risk of Fall With Assistance. Outcome: Progressing Flowsheets (Taken 10/10/2018 1246) [id number] risk of fall  with assistance/device: 4-Minimal assistance   Problem: RH PAIN MANAGEMENT Goal: RH STG PAIN MANAGED AT OR BELOW PT'S PAIN GOAL Description <4  Outcome: Progressing   Problem: RH KNOWLEDGE DEFICIT GENERAL Goal: RH STG INCREASE KNOWLEDGE OF SELF CARE AFTER HOSPITALIZATION Description Increase knowledge of self care after hospital discharge with min assist.  Outcome: Progressing

## 2018-10-11 ENCOUNTER — Inpatient Hospital Stay (HOSPITAL_COMMUNITY): Payer: Self-pay

## 2018-10-11 ENCOUNTER — Inpatient Hospital Stay (HOSPITAL_COMMUNITY): Payer: Self-pay | Admitting: Occupational Therapy

## 2018-10-11 LAB — GLUCOSE, CAPILLARY
Glucose-Capillary: 174 mg/dL — ABNORMAL HIGH (ref 70–99)
Glucose-Capillary: 231 mg/dL — ABNORMAL HIGH (ref 70–99)
Glucose-Capillary: 189 mg/dL — ABNORMAL HIGH (ref 70–99)
Glucose-Capillary: 196 mg/dL — ABNORMAL HIGH (ref 70–99)

## 2018-10-11 LAB — BASIC METABOLIC PANEL
Anion gap: 12 (ref 5–15)
BUN: 11 mg/dL (ref 6–20)
CO2: 29 mmol/L (ref 22–32)
Calcium: 9.3 mg/dL (ref 8.9–10.3)
Chloride: 94 mmol/L — ABNORMAL LOW (ref 98–111)
Creatinine, Ser: 0.67 mg/dL (ref 0.44–1.00)
GFR calc Af Amer: >60 mL/min (ref >60)
GFR calc non Af Amer: >60 mL/min (ref >60)
Glucose, Bld: 199 mg/dL — ABNORMAL HIGH (ref 70–99)
Potassium: 4.1 mmol/L (ref 3.5–5.1)
Sodium: 135 mmol/L (ref 135–145)

## 2018-10-11 MED ORDER — INSULIN DETEMIR 100 UNIT/ML ~~LOC~~ SOLN
28.0000 [IU] | Freq: Two times a day (BID) | SUBCUTANEOUS | Status: DC
  Administered 2018-10-11 – 2018-10-13 (×4): 28 [IU] via SUBCUTANEOUS
  Filled 2018-10-11 (×6): qty 0.28

## 2018-10-11 NOTE — Progress Notes (Signed)
Occupational Therapy Session Note  Patient Details  Name: Angela Booth MRN: [id number] Date of Birth: 1965/11/24  Today's Date: 10/11/2018 OT Individual Time: 1230-1300 OT Individual Time Calculation (min): 30 min    Short Term Goals: Week 1:  OT Short Term Goal 1 (Week 1): Pt will don LB clothing items with mod A in order to decrease level of A with self care. OT Short Term Goal 2 (Week 1): Pt will perform slide board transfer to drop arm commode chair mod A in order to decrease level of A with functional transfers.  OT Short Term Goal 3 (Week 1): Pt will demonstrate exercises to decrease edema in L UE with min cuing for technique.   Skilled Therapeutic Interventions/Progress Updates:    Pt resting in bed upon arrival, stating she is drowsy. OT intervention with focus on LUE PROM/AAROM. Edema improved.  Kinesio Tape applied to continue edema management.  Increase in shoulder adduction and flexion. Pt with difficulty keeping eyes open. Pt remained in bed with all needs within reach and bed alarm activated.   Therapy Documentation Precautions:  Precautions Precautions: Fall Required Braces or Orthoses: Other Brace Other Brace: hinged knee brace on L LE  Restrictions Weight Bearing Restrictions: Yes LUE Weight Bearing: Weight bearing as tolerated LLE Weight Bearing: Non weight bearing Other Position/Activity Restrictions: hinged knee brace locked in ext at night can be unlocked during day for ROM Pain: Pain Assessment Pain Scale: 0-10 Pain Score: 7  Pain Type: Surgical pain Pain Location: Leg Pain Orientation: Left Pain Descriptors / Indicators: Aching;Discomfort Pain Frequency: Constant Pain Onset: On-going Pain Intervention(s):RN aware and meds admin   Therapy/Group: Individual Therapy  Rich Brave 10/11/2018, 2:47 PM

## 2018-10-11 NOTE — Progress Notes (Addendum)
Occupational Therapy Session Note  Patient Details  Name: Angela Booth MRN: [id number] Date of Birth: 29-Jul-1965  Today's Date: 10/11/2018 OT Individual Time: 1000-1100 60 minutes   Short Term Goals: Week 1:  OT Short Term Goal 1 (Week 1): Pt will don LB clothing items with mod A in order to decrease level of A with self care. OT Short Term Goal 2 (Week 1): Pt will perform slide board transfer to drop arm commode chair mod A in order to decrease level of A with functional transfers.  OT Short Term Goal 3 (Week 1): Pt will demonstrate exercises to decrease edema in L UE with min cuing for technique.   Skilled Therapeutic Interventions/Progress Updates:    Patient seated in w/c and ready for therapy session.  She requests a sponge bath and change of clothes.  Pain is at a 5 but she states that it is tolerable at this time.  Left LE brace secured .  Patient completed UB bathing and grooming at sink w/c level with min a for thoroughness of left side due to limited reach of right UE.  Patient completed washing of abdomin, peri area and buttocks in stance with platform RW mod/max A due to balance and maintaining NWB of left LE.  She is able to stand from w/c with CG A and maintain NWB for 2-3 minutes at a time.  Patient requires max a to doff shorts and wash Left LE and bilateral feet.  Patient able to donn long dress with min A.  Patient remained in w/c at close of session with call bell in reach.    Therapy Documentation Precautions:  Precautions Precautions: Fall Required Braces or Orthoses: Other Brace Other Brace: hinged knee brace on L LE  Restrictions Weight Bearing Restrictions: Yes LUE Weight Bearing: Weight bearing as tolerated LLE Weight Bearing: Non weight bearing Other Position/Activity Restrictions: hinged knee brace locked in ext at night can be unlocked during day for ROM General:   Vital Signs: Therapy Vitals Temp: 98.3 F (36.8 C) Temp Source: Oral Pulse Rate:  90 Resp: 18 BP: 110/69 Patient Position (if appropriate): Lying Oxygen Therapy SpO2: 97 % O2 Device: Room Air Pain: Pain Assessment Pain Scale: 0-10 Pain Score: 5  Pain Location: Leg Pain Orientation: Left Pain Descriptors / Indicators: Discomfort Pain Intervention(s): Repositioned;Elevated extremity Other Treatments:     Therapy/Group: Individual Therapy  Barrie Lyme 10/11/2018, 4:21 PM

## 2018-10-11 NOTE — Progress Notes (Signed)
Social Work Patient ID: Angela Booth, female   DOB: October 03, 1965, 53 y.o.   MRN: [id number] Met with pt to inform team conference goals min assist level and target discharge date 4/25. She is doing better each day with her therapies which is encouraging to her. She does still have pain issues with her fractures and wounds. Will work on equipment and follow up needs.

## 2018-10-11 NOTE — Progress Notes (Signed)
[place] PHYSICAL MEDICINE & REHABILITATION PROGRESS NOTE  Subjective/Complaints: Patient seen laying in bed this morning.  She states he slept well overnight.  She states that she asked to make modifications to her platform walker to assess for improved ambulation.  She has questions about expectorations and goals at discharge.  Discussed gradual decreasing pain medications.  ROS: Denies CP, SOB, N/V/D  Objective: Vital Signs: Blood pressure 127/61, pulse 86, temperature 98.2 F (36.8 C), temperature source Oral, resp. rate 18, height 5\' 3"  (1.6 m), weight 103.3 kg, SpO2 98 %. No results found. No results for input(s): WBC, HGB, HCT, PLT in the last 72 hours. Recent Labs    10/11/18 0708  NA 135  K 4.1  CL 94*  CO2 29  GLUCOSE 199*  BUN 11  CREATININE 0.67  CALCIUM 9.3    Physical Exam: BP 127/61 (BP Location: Right Arm)   Pulse 86   Temp 98.2 F (36.8 C) (Oral)   Resp 18   Ht 5\' 3"  (1.6 m)   Wt 103.3 kg   SpO2 98%   BMI 40.34 kg/m  Constitutional: No distress . Vital signs reviewed. Obese HENT: Normocephalic.  Atraumatic. Eyes: EOMI.  No discharge. Cardiovascular: No JVD. Respiratory: Normal effort. GI: Non-distended. Musc: Left sided edema and tenderness Neurological: She is alert and oriented Motor:  LUE: Shoulder abduction, elbow flex/ext 2-2+/5, hand grip 3+-4-/5, improving LLE: HF 2+/5, knee in brace, ADF 4+5, improving Sensation intact to light touch Skin:  Scattered abrasions Left arm incision c/d/i  Psychiatric: She has a normal mood and affect. Her behavior is normal.   Assessment/Plan: 1. Functional deficits secondary to multi-ortho which require 3+ hours per day of interdisciplinary therapy in a comprehensive inpatient rehab setting.  Physiatrist is providing close team supervision and 24 hour management of active medical problems listed below.  Physiatrist and rehab team continue to assess barriers to discharge/monitor patient progress  toward functional and medical goals  Care Tool:  Bathing    Body parts bathed by patient: Left arm, Chest, Abdomen, Front perineal area, Right upper leg, Face   Body parts bathed by helper: Right arm, Buttocks, Left upper leg, Right lower leg, Left lower leg Body parts n/a: Left lower leg   Bathing assist Assist Level: Moderate Assistance - Patient 50 - 74%     Upper Body Dressing/Undressing Upper body dressing   What is the patient wearing?: Dress    Upper body assist Assist Level: Supervision/Verbal cueing    Lower Body Dressing/Undressing Lower body dressing    Lower body dressing activity did not occur: N/A(used a dress) What is the patient wearing?: Pants     Lower body assist Assist for lower body dressing: Contact Guard/Touching assist     Toileting Toileting Toileting Activity did not occur (Clothing management and hygiene only): Safety/medical concerns  Toileting assist Assist for toileting: Maximal Assistance - Patient 25 - 49%     Transfers Chair/bed transfer  Transfers assist     Chair/bed transfer assist level: Minimal Assistance - Patient > 75%     Locomotion Ambulation   Ambulation assist      Assist level: Maximal Assistance - Patient 25 - 49% Assistive device: Walker-platform Max distance: 2   Walk 10 feet activity   Assist  Walk 10 feet activity did not occur: Safety/medical concerns        Walk 50 feet activity   Assist Walk 50 feet with 2 turns activity did not occur: Safety/medical concerns  Walk 150 feet activity   Assist Walk 150 feet activity did not occur: Safety/medical concerns         Walk 10 feet on uneven surface  activity   Assist Walk 10 feet on uneven surfaces activity did not occur: Safety/medical concerns         Wheelchair     Assist Will patient use wheelchair at discharge?: Yes Type of Wheelchair: Manual    Wheelchair assist level: Supervision/Verbal cueing Max  wheelchair distance: 44'    Wheelchair 50 feet with 2 turns activity    Assist    Wheelchair 50 feet with 2 turns activity did not occur: Refused       Wheelchair 150 feet activity     Assist Wheelchair 150 feet activity did not occur: Safety/medical concerns          Medical Problem List and Plan: 1.  Deficits with mobility, transfers, endurance, self-care secondary to multi-Ortho.  Cont CIR 2.  Antithrombotics: -DVT/anticoagulation:  Pharmaceutical: Lovenox             -antiplatelet therapy: N/A 3. Pain Management: MSIR PRN for severe pain             Relatively controlled on 4/16 4. Mood: LCSW to follow for evaluation and support             -antipsychotic agents: N/A  5. Neuropsych: This patient is capable of making decisions on her own behalf. 6. Skin/Wound Care: Monitor wound daily for healing.   Added protein supplements due to low calorie malnutrition.  7. Fluids/Electrolytes/Nutrition: Routine I/Os.  8. ABLA: Added iron supplement.   Hemoglobin 8.6 on 4/11  Continue to monitor 9.T2DM poorly controlled: Hgb A1c- 10.0--question compliance with insulin.   Increased Levemir to 23 units twice daily on 4/13, increased to 25 BID on 4/15, increased to 28 on 4/16.   SSI for elevated BS.   Monitor with increased mobility.  10.  Morbid obesity: Educated patient on weight loss to help promote health as well as mobility.    Consulted dietitian for dietary education 11.  Hypokalemia  Potassium 4.1 on 4/16 after supplementation  Continue to monitor  LOS: 6 days A FACE TO FACE EVALUATION WAS PERFORMED  Angela Booth Angela Booth 10/11/2018, 8:43 AM

## 2018-10-11 NOTE — Progress Notes (Addendum)
Physical Therapy Session Note  Patient Details  Name: Angela Booth MRN: [id number] Date of Birth: 1966/05/23  Today's Date: 10/11/2018 PT Individual Time: 0800-0900 and 1515-1600 PT Individual Time Calculation (min): 60 min  And 45 min  Short Term Goals: Week 1:  PT Short Term Goal 1 (Week 1): Pt will perform bed mobility with mod assist  PT Short Term Goal 2 (Week 1): Pt will performed wc<>bed transfer with mod assist consistently  PT Short Term Goal 3 (Week 1): Pt will perform sit<>stand with max assist at RW  PT Short Term Goal 4 (Week 1): Pt will prollel WC 133ft with min assist   Skilled Therapeutic Interventions/Progress Updates:   tx 1:  Pt resting in bed.  Denies pain. Wearing knee brace, locked.  With leading questions, pt able to recognize that brace had slid inferiorly about 1.5 ".  PT adjusted brace with help from pt.  Pt's arms are short in relation to trunk and she is unable to reach lower straps. Pt donned shorts with mod assist in bed.   Supine> sit with mod assist.  Sit> stand with PFRW with CGA, pivot to [facility] with min assist.  Pt voided on BSC and performed peri care with extra time and PT help to remove L shorts leg .  Hand hygiene with set up.  Sit> stand at sink with min assist.  Standing Therapeutic exercise performed with LE to increase strength for functional mobility: 10 x 1 each L hip flexion<> neutral, L hip abduction, L hip extension >< neutral.  Seated L 10 x 1 AAROM L knee extension.  Pt left resting in w/c with needs at hand and L knee brace re-adjusted.  tx 2:  Pt sound asleep but did awaken to name being called.  She denied pain, but said that she was very tired from AM session. Knee brace already donned.  Supine Therapeutic exercise performed with LEs to increase strength for functional mobility: 30 x 2 bil ankle pumps, 15 x 2 R straight leg raises.   AAROM L hip and L knee.   Pt used breathing techniques for pain, during AA heel slides, minimal  excursion.  Pt left resting with knee brace donned, bed alarm set and needs in place.  Recommend pt try RW with bil forearm supports.      Therapy Documentation Precautions:  Precautions Precautions: Fall Required Braces or Orthoses: Other Brace Other Brace: hinged knee brace on L LE  Restrictions Weight Bearing Restrictions: Yes LUE Weight Bearing: Weight bearing as tolerated LLE Weight Bearing: Non weight bearing Other Position/Activity Restrictions: hinged knee brace locked in ext at night can be unlocked during day for ROM         Therapy/Group: Individual Therapy  Mayetta Castleman 10/11/2018, 9:12 AM

## 2018-10-12 ENCOUNTER — Inpatient Hospital Stay (HOSPITAL_COMMUNITY): Payer: Self-pay | Admitting: Occupational Therapy

## 2018-10-12 ENCOUNTER — Inpatient Hospital Stay (HOSPITAL_COMMUNITY): Payer: Self-pay

## 2018-10-12 LAB — BASIC METABOLIC PANEL
Anion gap: 10 (ref 5–15)
BUN: 9 mg/dL (ref 6–20)
CO2: 28 mmol/L (ref 22–32)
Calcium: 9 mg/dL (ref 8.9–10.3)
Chloride: 97 mmol/L — ABNORMAL LOW (ref 98–111)
Creatinine, Ser: 0.83 mg/dL (ref 0.44–1.00)
GFR calc Af Amer: >60 mL/min (ref >60)
GFR calc non Af Amer: >60 mL/min (ref >60)
Glucose, Bld: 188 mg/dL — ABNORMAL HIGH (ref 70–99)
Potassium: 4.3 mmol/L (ref 3.5–5.1)
Sodium: 135 mmol/L (ref 135–145)

## 2018-10-12 LAB — GLUCOSE, CAPILLARY
Glucose-Capillary: 177 mg/dL — ABNORMAL HIGH (ref 70–99)
Glucose-Capillary: 276 mg/dL — ABNORMAL HIGH (ref 70–99)
Glucose-Capillary: 157 mg/dL — ABNORMAL HIGH (ref 70–99)
Glucose-Capillary: 244 mg/dL — ABNORMAL HIGH (ref 70–99)

## 2018-10-12 LAB — CBC
HCT: 27.3 % — ABNORMAL LOW (ref 36.0–46.0)
Hemoglobin: 8.7 g/dL — ABNORMAL LOW (ref 12.0–15.0)
MCH: 29.3 pg (ref 26.0–34.0)
MCHC: 31.9 g/dL (ref 30.0–36.0)
MCV: 91.9 fL (ref 80.0–100.0)
Platelets: 354 10*3/uL (ref 150–400)
RBC: 2.97 MIL/uL — ABNORMAL LOW (ref 3.87–5.11)
RDW: 14.6 % (ref 11.5–15.5)
WBC: 8.2 10*3/uL (ref 4.0–10.5)
nRBC: 0.5 % — ABNORMAL HIGH (ref 0.0–0.2)

## 2018-10-12 MED ORDER — MORPHINE SULFATE 15 MG PO TABS
7.5000 mg | ORAL_TABLET | Freq: Four times a day (QID) | ORAL | Status: DC | PRN
  Filled 2018-10-12: qty 1

## 2018-10-12 MED ORDER — TRAMADOL HCL 50 MG PO TABS
50.0000 mg | ORAL_TABLET | Freq: Four times a day (QID) | ORAL | Status: DC | PRN
  Administered 2018-10-12 – 2018-10-18 (×13): 50 mg via ORAL
  Filled 2018-10-12 (×14): qty 1

## 2018-10-12 NOTE — Progress Notes (Signed)
[place] PHYSICAL MEDICINE & REHABILITATION PROGRESS NOTE  Subjective/Complaints: Patient seen laying in bed this morning.  She states she slept well overnight.  She states he had a rough day yesterday with therapies, but is aware that she will have better days than others.  Discussed decreasing pain medications.  ROS: Denies CP, SOB, N/V/D  Objective: Vital Signs: Blood pressure (!) 128/54, pulse 82, temperature 97.9 F (36.6 C), temperature source Oral, resp. rate 15, height 5\' 3"  (1.6 m), weight 103.3 kg, SpO2 96 %. No results found. Recent Labs    10/12/18 0632  WBC 8.2  HGB 8.7*  HCT 27.3*  PLT 354   Recent Labs    10/11/18 0708 10/12/18 0632  NA 135 135  K 4.1 4.3  CL 94* 97*  CO2 29 28  GLUCOSE 199* 188*  BUN 11 9  CREATININE 0.67 0.83  CALCIUM 9.3 9.0    Physical Exam: BP (!) 128/54 (BP Location: Right Arm)   Pulse 82   Temp 97.9 F (36.6 C) (Oral)   Resp 15   Ht 5\' 3"  (1.6 m)   Wt 103.3 kg   SpO2 96%   BMI 40.34 kg/m  Constitutional: No distress . Vital signs reviewed. Obese HENT: Normocephalic.  Atraumatic Eyes: EOMI.  No discharge. Cardiovascular: No JVD. Respiratory: Normal effort. GI: Non-distended. Musc: Left sided edema and tenderness Neurological: She is alert and oriented Motor:  LUE: Shoulder abduction, elbow flex/ext 3/5, hand grip 3+-4-/5 LLE: HF 2+/5, knee in brace, ADF 4+5, improving Sensation intact to light touch Skin:  Scattered abrasions Left arm incision c/d/i  Psychiatric: She has a normal mood and affect. Her behavior is normal.   Assessment/Plan: 1. Functional deficits secondary to multi-ortho which require 3+ hours per day of interdisciplinary therapy in a comprehensive inpatient rehab setting.  Physiatrist is providing close team supervision and 24 hour management of active medical problems listed below.  Physiatrist and rehab team continue to assess barriers to discharge/monitor patient progress toward functional  and medical goals  Care Tool:  Bathing    Body parts bathed by patient: Left arm, Chest, Abdomen, Front perineal area, Right upper leg, Face   Body parts bathed by helper: Right arm, Buttocks, Left upper leg, Right lower leg, Left lower leg Body parts n/a: Left lower leg   Bathing assist Assist Level: Moderate Assistance - Patient 50 - 74%     Upper Body Dressing/Undressing Upper body dressing   What is the patient wearing?: Dress    Upper body assist Assist Level: Supervision/Verbal cueing    Lower Body Dressing/Undressing Lower body dressing    Lower body dressing activity did not occur: N/A(used a dress) What is the patient wearing?: Pants     Lower body assist Assist for lower body dressing: Contact Guard/Touching assist     Toileting Toileting Toileting Activity did not occur (Clothing management and hygiene only): Safety/medical concerns  Toileting assist Assist for toileting: Maximal Assistance - Patient 25 - 49%     Transfers Chair/bed transfer  Transfers assist     Chair/bed transfer assist level: Minimal Assistance - Patient > 75%     Locomotion Ambulation   Ambulation assist      Assist level: Maximal Assistance - Patient 25 - 49% Assistive device: Walker-platform Max distance: 2   Walk 10 feet activity   Assist  Walk 10 feet activity did not occur: Safety/medical concerns        Walk 50 feet activity   Assist Walk 50  feet with 2 turns activity did not occur: Safety/medical concerns         Walk 150 feet activity   Assist Walk 150 feet activity did not occur: Safety/medical concerns         Walk 10 feet on uneven surface  activity   Assist Walk 10 feet on uneven surfaces activity did not occur: Safety/medical concerns         Wheelchair     Assist Will patient use wheelchair at discharge?: Yes Type of Wheelchair: Manual    Wheelchair assist level: Supervision/Verbal cueing Max wheelchair distance: 45'     Wheelchair 50 feet with 2 turns activity    Assist    Wheelchair 50 feet with 2 turns activity did not occur: Refused       Wheelchair 150 feet activity     Assist Wheelchair 150 feet activity did not occur: Safety/medical concerns          Medical Problem List and Plan: 1.  Deficits with mobility, transfers, endurance, self-care secondary to multi-Ortho.  Cont CIR 2.  Antithrombotics: -DVT/anticoagulation:  Pharmaceutical: Lovenox             -antiplatelet therapy: N/A 3. Pain Management: MSIR PRN for severe pain, decreased to 7.5 every 6 as needed on 4/17             Relatively controlled on 4/17 4. Mood: LCSW to follow for evaluation and support             -antipsychotic agents: N/A  5. Neuropsych: This patient is capable of making decisions on her own behalf. 6. Skin/Wound Care: Monitor wound daily for healing.   Added protein supplements due to low calorie malnutrition.  7. Fluids/Electrolytes/Nutrition: Routine I/Os.  8. ABLA: Added iron supplement.   Hemoglobin 8.7 on 4/17  Continue to monitor 9.T2DM poorly controlled: Hgb A1c- 10.0--question compliance with insulin.   Increased Levemir to 23 units twice daily on 4/13, increased to 25 BID on 4/15, increased to 28 on 4/16.   Remains elevated on 4/17, will likely require further adjustments  SSI for elevated BS.   Monitor with increased mobility.  10.  Morbid obesity: Educated patient on weight loss to help promote health as well as mobility.    Consulted dietitian for dietary education 11.  Hypokalemia  Potassium 4.3 on 4/17 after supplementation  Continue to monitor  LOS: 7 days A FACE TO FACE EVALUATION WAS PERFORMED  Ankit Karis Juba 10/12/2018, 8:55 AM

## 2018-10-12 NOTE — Progress Notes (Signed)
Occupational Therapy Session Note  Patient Details  Name: Angela Booth MRN: [id number] Date of Birth: Oct 07, 1965  Today's Date: 10/12/2018 OT Individual Time: 2924-4628 OT Individual Time Calculation (min): 60 min    Short Term Goals: Week 1:  OT Short Term Goal 1 (Week 1): Pt will don LB clothing items with mod A in order to decrease level of A with self care. OT Short Term Goal 2 (Week 1): Pt will perform slide board transfer to drop arm commode chair mod A in order to decrease level of A with functional transfers.  OT Short Term Goal 3 (Week 1): Pt will demonstrate exercises to decrease edema in L UE with min cuing for technique.      Skilled Therapeutic Interventions/Progress Updates:    Pt received sitting on BSC. She had just had a BM.  Unable to reach back with R arm to cleanse because she could not weight shift onto L leg due to brace. Pt could cleanse front perineal area by leaning back.  Sit to stand to [place] with min A and then stand pivot to w/c with CGA.  Discussed difficulty of her feeling balanced when rising to stand and standing with platform on L.  Discussed trying it without after she finishes her self care.  Pt sat at sink in w/c to bathe and dress UB and legs.  Pt was able to flex L elbow to reach under R arm to wash and apply deoderant.  Pt washed R leg up to her lower shin.  After self care completed, practiced sit to stand without platform on RW.  It is easier for her to rise to stand BUT much more difficult for her to turn the RW.  Placed platform back on and cued her to only use L side as a stabilized and really push through her R arm and leg.  This worked better for her and she stated she felt more balanced.  Pt resting in w/c at end of session with all needs met.  Therapy Documentation Precautions:  Precautions Precautions: Fall Required Braces or Orthoses: Other Brace Other Brace: hinged knee brace on L LE  Restrictions Weight Bearing Restrictions: Yes LUE  Weight Bearing: Weight bearing as tolerated LLE Weight Bearing: Non weight bearing Other Position/Activity Restrictions: hinged knee brace locked in ext at night can be unlocked during day for ROM    Pain: Pain Assessment Pain Score: 0-No pain(premedicated)   Therapy/Group: Individual Therapy  [place] 10/12/2018, 11:18 AM

## 2018-10-12 NOTE — Progress Notes (Signed)
Occupational Therapy Weekly Progress Note  Patient Details  Name: Angela Booth MRN: [id number] Date of Birth: 1966-06-21  Beginning of progress report period: October 06, 2018 End of progress report period: October 06, 2018   Patient has met 2 of 3 short term goals.  Pt now has more elbow flexibility so she can use her L arm during ADLs for UB bathing.  She is also rising to stand and transferring with PFRW with min A overall.  Toileting is a challenge for pt due to difficulty reaching with cleansing and managing clothing. This week she will need to continue to focus on standing balance with the RW to enable her to do those tasks more efficiently.  Overall she needs mod-max A with LB dressing (but pt has recently chosen to wear long dresses without underwear) and max A toileting.    Patient continues to demonstrate the following deficits: muscle weakness and muscle joint tightness, decreased cardiorespiratoy endurance and decreased standing balance and decreased balance strategies and therefore will continue to benefit from skilled OT intervention to enhance overall performance with BADL.  Patient progressing toward long term goals..  Continue plan of care.  OT Short Term Goals Week 1:  OT Short Term Goal 1 (Week 1): Pt will don LB clothing items with mod A in order to decrease level of A with self care. OT Short Term Goal 1 - Progress (Week 1): Progressing toward goal OT Short Term Goal 2 (Week 1): Pt will perform slide board transfer to drop arm commode chair mod A in order to decrease level of A with functional transfers.  OT Short Term Goal 2 - Progress (Week 1): Met OT Short Term Goal 3 (Week 1): Pt will demonstrate exercises to decrease edema in L UE with min cuing for technique.  OT Short Term Goal 3 - Progress (Week 1): Met Week 2:  OT Short Term Goal 1 (Week 2): STGs = LTGs      Therapy Documentation Precautions:  Precautions Precautions: Fall Required Braces or Orthoses: Other  Brace Other Brace: hinged knee brace on L LE  Restrictions Weight Bearing Restrictions: Yes LUE Weight Bearing: Weight bearing as tolerated LLE Weight Bearing: Non weight bearing Other Position/Activity Restrictions: hinged knee brace locked in ext at night can be unlocked during day for ROM   Jakaya Jacobowitz 10/12/2018, 11:20 AM

## 2018-10-12 NOTE — Progress Notes (Signed)
Occupational Therapy Session Note  Patient Details  Name: LAKYNN HALVORSEN MRN: [id number] Date of Birth: 04/04/1966  Today's Date: 10/12/2018 OT Individual Time: 8867-7373 OT Individual Time Calculation (min): 41 min    Short Term Goals: Week 1:  OT Short Term Goal 1 (Week 1): Pt will don LB clothing items with mod A in order to decrease level of A with self care. OT Short Term Goal 1 - Progress (Week 1): Progressing toward goal OT Short Term Goal 2 (Week 1): Pt will perform slide board transfer to drop arm commode chair mod A in order to decrease level of A with functional transfers.  OT Short Term Goal 2 - Progress (Week 1): Met OT Short Term Goal 3 (Week 1): Pt will demonstrate exercises to decrease edema in L UE with min cuing for technique.  OT Short Term Goal 3 - Progress (Week 1): Met  Skilled Therapeutic Interventions/Progress Updates:    Pt worked on tub transfers with use of the RW with bilateral platforms and tub bench.  She was able to complete transfer with mod assist.  Max facilitation for lifting the LLE over the edge of the tub.  She then completed transfer out at the same level.  Discussed need for shower tub bench at home as well as hand held shower.  Pt will discuss with her husband and let us know if she would like to purchase.  She then completed session with use of the UE ergonometer with BUEs at a slow rate.  She was able to tolerate for a period of 3 mins with resistance on level 1 and RPMs maintained at 7.  Finished session with pt pushing part of the way back to the room with increased time and supervision for approximately 50' before needing a rest break.  Therapist took her back to the room the rest of the way and left pt in the wheelchair with call button and phone in reach in order to eat her lunch.    Therapy Documentation Precautions:  Precautions Precautions: Fall Required Braces or Orthoses: Other Brace Other Brace: hinged knee brace on L LE   Restrictions Weight Bearing Restrictions: Yes LUE Weight Bearing: Weight bearing as tolerated LLE Weight Bearing: Non weight bearing Other Position/Activity Restrictions: hinged knee brace locked in ext at night can be unlocked during day for ROM  Pain: Pain Assessment Pain Scale: Faces Pain Score: 6  Faces Pain Scale: Hurts little more Pain Type: Surgical pain Pain Location: Knee Pain Orientation: Left Pain Descriptors / Indicators: Discomfort Pain Frequency: Constant Pain Onset: With Activity Pain Intervention(s): Repositioned;Emotional support Multiple Pain Sites: No ADL: See Care Tool Section for some details of ADL  Therapy/Group: Individual Therapy  Kahlin Mark OTR/L 10/12/2018, 3:33 PM

## 2018-10-12 NOTE — Progress Notes (Signed)
Physical Therapy Session Note  Patient Details  Name: Erie NoeDeidre D Aguilera MRN: [id number] Date of Birth: 02-14-1966  Today's Date: 10/12/2018 PT Individual Time: [id number] and 1420-1520 PT Individual Time Calculation (min): 45 min and 60 min  Short Term Goals: Week 1:  PT Short Term Goal 1 (Week 1): Pt will perform bed mobility with mod assist  PT Short Term Goal 2 (Week 1): Pt will performed wc<>bed transfer with mod assist consistently  PT Short Term Goal 3 (Week 1): Pt will perform sit<>stand with max assist at RW  PT Short Term Goal 4 (Week 1): Pt will prollel WC 15900ft with min assist   Skilled Therapeutic Interventions/Progress Updates:     Session 1: Patient in w/c upon PT arrival. Patient alert and agreeable to PT session. Bledsoe brace was donned on L LE prior to session. PT adjusted brace to correct position with patient's assistance during session and the brace remained donned throughout session.  Therapeutic Activity: Transfers: Patient performed sit to/from stand x3 with mod A using B platform RW. The RW in her room was too narrow for her to stand in with B platforms, switched RW out for a bariatric RW and reapplied B platforms to the new RW. Patient able to stand comfortable inside the bariatric RW. Provided verbal cues for hand placement, L UE on platform and R UE pushing up from w/c, and NWB on L LE, patient does well with maintaining weight bearing precautions in standing.  Gait Training:  Patient ambulated 2 steps forward using B platform RW with mod A for physical support and advancing RW. Ambulated with a hop-to gait pattern on R LE wearing a tennis shoe with limited foot clearance. Provided verbal cues for pushing through UE to lift her foot off of the ground as opposed to hopping.  Therapeutic Exercise: Patient performed the following exercises in sitting with verbal and tactile cues for proper technique. -L LAQ with 3 second hold x20 for 2 sets -R Ankle pumps x20 -R  ankle circles clockwise and counterclockwise x20  Patient in w/c at end of session with breaks locked, L LE elevated on ELR with 1 pillow, and all needs within reach. Patient inquired about appropriate positioning for when she would like to be intimate with her husband upon d/c. Therapist educated on speaking to her doctor about being medically cleared for sexual activity prior to d/c and provided educated on R side-lying and supine positioning as being the most appropriate with regards to her injuries. Will continue with further education on positioning and safety as time allows throughout patient's stay.   Session 2: Patient in w/c upon PT arrival. Patient alert and agreeable to PT session.  Therapeutic Activity: Bed Mobility: Patient sat EOB and performed UB dressing with supervision for safety. Patient performed sit to supine with min A for L LE support and scooting up in bed x2 with min A with use of bed rails. Bledsoe brace was donned on L LE prior to session. PT adjusted brace to correct position with patient's assistance during session and the brace remained donned throughout session. Patient reported feeling "foggy" from the pain medicine this afternoon.  Transfers: Patient performed sit to/from stand x2 with min A-CGA using B platform RW. Provided verbal cues for pushing up from the w/c and reaching back before sitting. [facility] B platform RW with min A. Cues provided for moving R foot using swivel technique. 6" step placed under B LE's on  BSC for comfort in sitting. Patient voided on BSC and performed peri care with min A from therapist.   Gait Training:  Patient ambulated 2 feet forward and 2 feet sideays using B platform RW with CGA for safety and balance. Ambulated with hop-to gait pattern on R to maintain NWB on L with decreased R foot clearance. Provided verbal cues for use of UE to lift foot off the ground and sequencing with the RW when side  steping.  Wheelchair Mobility:  Patient propelled wheelchair forward and backwards 5 feet with supervision for safety using R UE and LE.   Therapeutic Exercise: Patient performed the following exercises with verbal and tactile cues for proper technique. Bledsoe brace unlocked for gentle ROM. -B glute sets with 5 second holds x10 -R knee and hip flexion/extension with manual resistance x10 -L knee flexion/extension with 50% assist for AAROM x5 -L gastroc stretch with 1 min hold x2 -B ankle pumps x20  Patient in bed at end of session with breaks locked, bed alarm set, and all needs within reach. Educated on POC and PT goals for next week and general weekend schedule.   Therapy Documentation Precautions:  Precautions Precautions: Fall Required Braces or Orthoses: Other Brace Other Brace: hinged knee brace on L LE  Restrictions Weight Bearing Restrictions: Yes LUE Weight Bearing: Weight bearing as tolerated LLE Weight Bearing: Non weight bearing Other Position/Activity Restrictions: hinged knee brace locked in ext at night can be unlocked during day for ROM Pain: Session 1: Pain Assessment Pain Score: 0-No pain(premedicated) Session 2: Pain: 5/10 L LE pain, constant, sore Pain intervention: reposition; distraction   Therapy/Group: Individual Therapy  Helayne Seminole, PT, DPT 10/12/2018, 12:27 PM

## 2018-10-13 ENCOUNTER — Inpatient Hospital Stay (HOSPITAL_COMMUNITY): Payer: Self-pay

## 2018-10-13 LAB — GLUCOSE, CAPILLARY
Glucose-Capillary: 176 mg/dL — ABNORMAL HIGH (ref 70–99)
Glucose-Capillary: 299 mg/dL — ABNORMAL HIGH (ref 70–99)
Glucose-Capillary: 166 mg/dL — ABNORMAL HIGH (ref 70–99)
Glucose-Capillary: 157 mg/dL — ABNORMAL HIGH (ref 70–99)
Glucose-Capillary: 262 mg/dL — ABNORMAL HIGH (ref 70–99)

## 2018-10-13 MED ORDER — INSULIN DETEMIR 100 UNIT/ML ~~LOC~~ SOLN
32.0000 [IU] | Freq: Two times a day (BID) | SUBCUTANEOUS | Status: DC
  Administered 2018-10-13 – 2018-10-14 (×3): 32 [IU] via SUBCUTANEOUS
  Filled 2018-10-13 (×6): qty 0.32

## 2018-10-13 NOTE — Progress Notes (Signed)
Physical Therapy Session Note  Patient Details  Name: Angela Booth MRN: [id number] Date of Birth: 1965/11/14  Today's Date: 10/13/2018 PT Individual Time: 1300-1330 PT Individual Time Calculation (min): 30 min   Short Term Goals: Week 1:  PT Short Term Goal 1 (Week 1): Pt will perform bed mobility with mod assist  PT Short Term Goal 2 (Week 1): Pt will performed wc<>bed transfer with mod assist consistently  PT Short Term Goal 3 (Week 1): Pt will perform sit<>stand with max assist at RW  PT Short Term Goal 4 (Week 1): Pt will prollel WC 149ft with min assist   Skilled Therapeutic Interventions/Progress Updates: pt resting in bed.  Supine> sit in flat bed, no rails , to L at pt's request, mod assist.  Pt agreed to try getting up on R side of bed during another session.  PT re-adjusted knee brace.  Sit> stand with RW to pivot to [facility] CGA.  Continent voiding on BSC.  Set- up for peri care.  Gait training with R/L PFRW x 10', including turns, from [facility]  > w/c, CGA.  Cues for lifting up LLE as she moved stand> sit.  At end of session, pt sitting in w/c with lunch on tray in front of her, needs at hand.     Therapy Documentation Precautions:  Precautions Precautions: Fall Required Braces or Orthoses: Other Brace Other Brace: hinged knee brace on L LE  Restrictions Weight Bearing Restrictions: Yes LUE Weight Bearing: Weight bearing as tolerated LLE Weight Bearing: Non weight bearing Other Position/Activity Restrictions: hinged knee brace locked in ext at night can be unlocked during day for ROM  Pain: 4/10 L knee, premedicated      Therapy/Group: Individual Therapy  Dakota Vanwart 10/13/2018, 2:34 PM

## 2018-10-13 NOTE — Progress Notes (Signed)
[place] PHYSICAL MEDICINE & REHABILITATION PROGRESS NOTE  Subjective/Complaints: No new issues. Slept well. In good spirits. Pain controlled  ROS: Patient denies fever, rash, sore throat, blurred vision, nausea, vomiting, diarrhea, cough, shortness of breath or chest pain, joint or back pain, headache, or mood change.    Objective: Vital Signs: Blood pressure 128/79, pulse 89, temperature 98.2 F (36.8 C), temperature source Oral, resp. rate 19, height 5\' 3"  (1.6 m), weight 103.3 kg, SpO2 98 %. No results found. Recent Labs    10/12/18 0632  WBC 8.2  HGB 8.7*  HCT 27.3*  PLT 354   Recent Labs    10/11/18 0708 10/12/18 0632  NA 135 135  K 4.1 4.3  CL 94* 97*  CO2 29 28  GLUCOSE 199* 188*  BUN 11 9  CREATININE 0.67 0.83  CALCIUM 9.3 9.0    Physical Exam: BP 128/79 (BP Location: Left Arm)   Pulse 89   Temp 98.2 F (36.8 C) (Oral)   Resp 19   Ht 5\' 3"  (1.6 m)   Wt 103.3 kg   SpO2 98%   BMI 40.34 kg/m  Constitutional: No distress . Vital signs reviewed. HEENT: EOMI, oral membranes moist Neck: supple Cardiovascular: RRR without murmur. No JVD    Respiratory: CTA Bilaterally without wheezes or rales. Normal effort    GI: BS +, non-tender, non-distended  Musc: Left sided edema and tenderness Neurological: She is alert and oriented Motor:  LUE: Shoulder abduction, elbow flex/ext 3/5, hand grip 3+-4-/5 LLE: HF 2+/5, knee in brace, ADF 4+5, improving Sensation intact to light touch Skin:  Scattered abrasions LUE and LLE Left arm incision c/d/i  Psychiatric: She has a normal mood and affect. Her behavior is normal.   Assessment/Plan: 1. Functional deficits secondary to multi-ortho which require 3+ hours per day of interdisciplinary therapy in a comprehensive inpatient rehab setting.  Physiatrist is providing close team supervision and 24 hour management of active medical problems listed below.  Physiatrist and rehab team continue to assess barriers to  discharge/monitor patient progress toward functional and medical goals  Care Tool:  Bathing    Body parts bathed by patient: Left arm, Chest, Abdomen, Front perineal area, Right upper leg, Face, Right arm   Body parts bathed by helper: Buttocks, Right lower leg Body parts n/a: Left upper leg, Left lower leg   Bathing assist Assist Level: Moderate Assistance - Patient 50 - 74%     Upper Body Dressing/Undressing Upper body dressing   What is the patient wearing?: Dress    Upper body assist Assist Level: Supervision/Verbal cueing    Lower Body Dressing/Undressing Lower body dressing    Lower body dressing activity did not occur: N/A(used a dress) What is the patient wearing?: Pants     Lower body assist Assist for lower body dressing: Contact Guard/Touching assist     Toileting Toileting Toileting Activity did not occur (Clothing management and hygiene only): Safety/medical concerns  Toileting assist Assist for toileting: Maximal Assistance - Patient 25 - 49%     Transfers Chair/bed transfer  Transfers assist     Chair/bed transfer assist level: Minimal Assistance - Patient > 75%     Locomotion Ambulation   Ambulation assist      Assist level: Contact Guard/Touching assist Assistive device: Walker-platform(B platform) Max distance: 2   Walk 10 feet activity   Assist  Walk 10 feet activity did not occur: Safety/medical concerns        Walk 50 feet activity  Assist Walk 50 feet with 2 turns activity did not occur: Safety/medical concerns         Walk 150 feet activity   Assist Walk 150 feet activity did not occur: Safety/medical concerns         Walk 10 feet on uneven surface  activity   Assist Walk 10 feet on uneven surfaces activity did not occur: Safety/medical concerns         Wheelchair     Assist Will patient use wheelchair at discharge?: Yes Type of Wheelchair: Manual    Wheelchair assist level:  Supervision/Verbal cueing Max wheelchair distance: 1040'    Wheelchair 50 feet with 2 turns activity    Assist    Wheelchair 50 feet with 2 turns activity did not occur: Refused       Wheelchair 150 feet activity     Assist Wheelchair 150 feet activity did not occur: Safety/medical concerns          Medical Problem List and Plan: 1.  Deficits with mobility, transfers, endurance, self-care secondary to multi-Ortho.  Cont CIR 2.  Antithrombotics: -DVT/anticoagulation:  Pharmaceutical: Lovenox             -antiplatelet therapy: N/A 3. Pain Management: MSIR PRN for severe pain, decreased to 7.5 every 6 as needed on 4/17             Relatively controlled on 4/17 4. Mood: LCSW to follow for evaluation and support             -antipsychotic agents: N/A  5. Neuropsych: This patient is capable of making decisions on her own behalf. 6. Skin/Wound Care: Monitor wound daily for healing.   Added protein supplements due to low calorie malnutrition.  7. Fluids/Electrolytes/Nutrition: Routine I/Os.  8. ABLA: Added iron supplement.   Hemoglobin 8.7 on 4/17  Continue to monitor 9.T2DM poorly controlled: Hgb A1c- 10.0--question compliance with insulin.   Increased Levemir to 23 units twice daily on 4/13, increased to 25 BID on 4/15, increased to 28 on 4/16.   Remains elevated: increase to 32u bid  SSI for elevated BS.   Monitor with increased mobility.  10.  Morbid obesity: Educated patient on weight loss to help promote health as well as mobility.    Consulted dietitian for dietary education 11.  Hypokalemia  Potassium 4.3 on 4/17 after supplementation  Continue to monitor  LOS: 8 days A FACE TO FACE EVALUATION WAS PERFORMED  Ranelle OysterZachary T Bellamia Ferch 10/13/2018, 8:49 AM

## 2018-10-14 ENCOUNTER — Inpatient Hospital Stay (HOSPITAL_COMMUNITY): Payer: Self-pay | Admitting: Occupational Therapy

## 2018-10-14 LAB — GLUCOSE, CAPILLARY
Glucose-Capillary: 178 mg/dL — ABNORMAL HIGH (ref 70–99)
Glucose-Capillary: 187 mg/dL — ABNORMAL HIGH (ref 70–99)
Glucose-Capillary: 129 mg/dL — ABNORMAL HIGH (ref 70–99)
Glucose-Capillary: 159 mg/dL — ABNORMAL HIGH (ref 70–99)
Glucose-Capillary: 213 mg/dL — ABNORMAL HIGH (ref 70–99)

## 2018-10-14 NOTE — Plan of Care (Signed)
  Problem: Consults Goal: RH GENERAL PATIENT EDUCATION Description See Patient Education module for education specifics. Outcome: Progressing Goal: Skin Care Protocol Initiated - if Braden Score 18 or less Description If consults are not indicated, leave blank or document N/A Outcome: Progressing Goal: Nutrition Consult-if indicated Outcome: Progressing Goal: Diabetes Guidelines if Diabetic/Glucose > 140 Description If diabetic or lab glucose is > 140 mg/dl - Initiate Diabetes/Hyperglycemia Guidelines & Document Interventions  Outcome: Progressing   Problem: RH BOWEL ELIMINATION Goal: RH STG MANAGE BOWEL WITH ASSISTANCE Description STG Manage Bowel with min Assistance.  Outcome: Progressing Goal: RH STG MANAGE BOWEL W/MEDICATION W/ASSISTANCE Description STG Manage Bowel with Medication with minAssistance.  Outcome: Progressing   Problem: RH SKIN INTEGRITY Goal: RH STG SKIN FREE OF INFECTION/BREAKDOWN Description Free of skin infection or skin breakdown while in Rehab with min assist  Outcome: Progressing Goal: RH STG MAINTAIN SKIN INTEGRITY WITH ASSISTANCE Description STG Maintain Skin Integrity With min Assistance.  Outcome: Progressing Goal: RH STG ABLE TO PERFORM INCISION/WOUND CARE W/ASSISTANCE Description STG Able To Perform Incision/Wound Care With mod I Assistance.  Outcome: Progressing   Problem: RH SAFETY Goal: RH STG ADHERE TO SAFETY PRECAUTIONS W/ASSISTANCE/DEVICE Description STG Adhere to Safety Precautions With mod Assistance/Device.  Outcome: Progressing Goal: RH STG DECREASED RISK OF FALL WITH ASSISTANCE Description STG Decreased Risk of Fall With mod I Assistance.  Outcome: Progressing   Problem: RH PAIN MANAGEMENT Goal: RH STG PAIN MANAGED AT OR BELOW PT'S PAIN GOAL Description <4  Outcome: Progressing   Problem: RH KNOWLEDGE DEFICIT GENERAL Goal: RH STG INCREASE KNOWLEDGE OF SELF CARE AFTER HOSPITALIZATION Description Increase knowledge of  self care after hospital discharge with min assist.  Outcome: Progressing   

## 2018-10-14 NOTE — Progress Notes (Signed)
Occupational Therapy Session Note  Patient Details  Name: Angela Booth MRN: [id number] Date of Birth: 1966-01-16  Today's Date: 10/14/2018 OT Individual Time: 1050-1200 OT Individual Time Calculation (min): 70 min    Short Term Goals: Week 2:  OT Short Term Goal 1 (Week 2): STGs = LTGs  Skilled Therapeutic Interventions/Progress Updates:    Pt seen for OT session focusing on ADL re-training and w/c mobility in prep for d/c at w/c level. Pt in supine upon arrival, agreeable to tx session. Reports having just received pain meds prior to tx session and pain diminishing without need for further intervention. She transferred to sitting EOB with min A for management of R LE and using hospital bed functions. Completed stand pivot transfers throughout session with PFRW and CGA. She transferred EOB>BSC> w/c. Completed toileting task with steadying assist. Transitioned to w/c. Pt desiring to don shorts this session. Introduced Sports administrator and sock aid to pt. Also provided handout on how to order from [employer]. Following demonstration and instructional cuing, pt able to thread LEs into pants with min A using reacher. She was able to don R sock independently with use of sock aid required min A on L due to brace. She stood to PFRW with steadying assist to pull pants up. Worked on pt fastening/unfastening hinged brace and adjusting for proper fit. Pt able to reach all but last distal buckle. Required frequent cuing for which part of strap to adjust.  Discussed at length pt's home ADL routine and bathing/dressing from various locations and pros/cons of each.  Practiced w/c mobility in hallway weaving through cones with VCs for effective w/c turning techniques. Rest breaks required throughout.  Pt returned to room at end of session, left seated in w/c with all needs in reach.   Therapy Documentation Precautions:  Precautions Precautions: Fall Required Braces or Orthoses: Other Brace Other Brace: hinged knee  brace on L LE  Restrictions Weight Bearing Restrictions: Yes LUE Weight Bearing: Weight bearing as tolerated LLE Weight Bearing: Non weight bearing Other Position/Activity Restrictions: hinged knee brace locked in ext at night can be unlocked during day for ROM   Therapy/Group: Individual Therapy  Makalya Nave L 10/14/2018, 6:51 AM

## 2018-10-14 NOTE — Progress Notes (Signed)
[place] PHYSICAL MEDICINE & REHABILITATION PROGRESS NOTE  Subjective/Complaints: Pt without new complaints. Pain seems controlled. Had a good night  ROS: Patient denies fever, rash, sore throat, blurred vision, nausea, vomiting, diarrhea, cough, shortness of breath or chest pain,  back pain, headache, or mood change.       Objective: Vital Signs: Blood pressure 135/81, pulse 86, temperature 98.2 F (36.8 C), temperature source Oral, resp. rate 18, height 5\' 3"  (1.6 m), weight 103.3 kg, SpO2 99 %. No results found. Recent Labs    10/12/18 0632  WBC 8.2  HGB 8.7*  HCT 27.3*  PLT 354   Recent Labs    10/12/18 0632  NA 135  K 4.3  CL 97*  CO2 28  GLUCOSE 188*  BUN 9  CREATININE 0.83  CALCIUM 9.0    Physical Exam: BP 135/81 (BP Location: Right Arm)   Pulse 86   Temp 98.2 F (36.8 C) (Oral)   Resp 18   Ht 5\' 3"  (1.6 m)   Wt 103.3 kg   SpO2 99%   BMI 40.34 kg/m  Constitutional: No distress . Vital signs reviewed. HEENT: EOMI, oral membranes moist Neck: supple Cardiovascular: RRR without murmur. No JVD    Respiratory: CTA Bilaterally without wheezes or rales. Normal effort    GI: BS +, non-tender, non-distended  Musc: Left sided edema and tenderness Neurological: She is alert and oriented Motor:  LUE: Shoulder abduction, elbow flex/ext 3/5, hand grip 3+-4-/5 LLE: HF 2+/5, knee in brace, ADF 4+5, improving Sensation intact to light touch Skin:  Scattered abrasions LUE and LLE--unchanged Left arm incision c/d/i  Psychiatric: She has a normal mood and affect. Her behavior is normal.   Assessment/Plan: 1. Functional deficits secondary to multi-ortho which require 3+ hours per day of interdisciplinary therapy in a comprehensive inpatient rehab setting.  Physiatrist is providing close team supervision and 24 hour management of active medical problems listed below.  Physiatrist and rehab team continue to assess barriers to discharge/monitor patient progress  toward functional and medical goals  Care Tool:  Bathing    Body parts bathed by patient: Left arm, Chest, Abdomen, Front perineal area, Right upper leg, Face, Right arm   Body parts bathed by helper: Buttocks, Right lower leg Body parts n/a: Left upper leg, Left lower leg   Bathing assist Assist Level: Moderate Assistance - Patient 50 - 74%     Upper Body Dressing/Undressing Upper body dressing   What is the patient wearing?: Dress    Upper body assist Assist Level: Supervision/Verbal cueing    Lower Body Dressing/Undressing Lower body dressing    Lower body dressing activity did not occur: N/A(used a dress) What is the patient wearing?: Pants     Lower body assist Assist for lower body dressing: Contact Guard/Touching assist     Toileting Toileting Toileting Activity did not occur (Clothing management and hygiene only): Safety/medical concerns  Toileting assist Assist for toileting: Minimal Assistance - Patient > 75%     Transfers Chair/bed transfer  Transfers assist     Chair/bed transfer assist level: Minimal Assistance - Patient > 75%     Locomotion Ambulation   Ambulation assist      Assist level: Contact Guard/Touching assist Assistive device: Walker-platform Max distance: 10   Walk 10 feet activity   Assist  Walk 10 feet activity did not occur: Safety/medical concerns  Assist level: Minimal Assistance - Patient > 75% Assistive device: Walker-platform   Walk 50 feet activity   Assist  Walk 50 feet with 2 turns activity did not occur: Safety/medical concerns         Walk 150 feet activity   Assist Walk 150 feet activity did not occur: Safety/medical concerns         Walk 10 feet on uneven surface  activity   Assist Walk 10 feet on uneven surfaces activity did not occur: Safety/medical concerns         Wheelchair     Assist Will patient use wheelchair at discharge?: Yes Type of Wheelchair: Manual    Wheelchair  assist level: Supervision/Verbal cueing Max wheelchair distance: 940'    Wheelchair 50 feet with 2 turns activity    Assist    Wheelchair 50 feet with 2 turns activity did not occur: Refused       Wheelchair 150 feet activity     Assist Wheelchair 150 feet activity did not occur: Safety/medical concerns          Medical Problem List and Plan: 1.  Deficits with mobility, transfers, endurance, self-care secondary to multi-Ortho.  Cont CIR PT OT 2.  Antithrombotics: -DVT/anticoagulation:  Pharmaceutical: Lovenox             -antiplatelet therapy: N/A 3. Pain Management: MSIR PRN for severe pain, decreased to 7.5 every 6 as needed on 4/17             Relatively controlled on 4/19 4. Mood: LCSW to follow for evaluation and support             -antipsychotic agents: N/A  5. Neuropsych: This patient is capable of making decisions on her own behalf. 6. Skin/Wound Care: Monitor wound daily for healing.   Added protein supplements due to low calorie malnutrition.  7. Fluids/Electrolytes/Nutrition: Routine I/Os.  8. ABLA: Added iron supplement.   Hemoglobin 8.7 on 4/17  Continue to monitor 9.T2DM poorly controlled: Hgb A1c- 10.0--continues to be poorly controlled  Increased Levemir to 23 units twice daily on 4/13, increased to 25 BID on 4/15, increased to 28 on 4/16.   Levemir now at 32u bid after increase 4/18  SSI for elevated BS.   Monitor trends today 10.  Morbid obesity: Educated patient on weight loss to help promote health as well as mobility.    Consulted dietitian for dietary education 11.  Hypokalemia  Potassium 4.3 on 4/17 after supplementation  Continue to monitor  LOS: 9 days A FACE TO FACE EVALUATION WAS PERFORMED  Ranelle OysterZachary T Azavion Bouillon 10/14/2018, 9:13 AM

## 2018-10-15 ENCOUNTER — Inpatient Hospital Stay (HOSPITAL_COMMUNITY): Payer: Self-pay

## 2018-10-15 ENCOUNTER — Inpatient Hospital Stay (HOSPITAL_COMMUNITY): Payer: Self-pay | Admitting: Occupational Therapy

## 2018-10-15 LAB — GLUCOSE, CAPILLARY
Glucose-Capillary: 201 mg/dL — ABNORMAL HIGH (ref 70–99)
Glucose-Capillary: 170 mg/dL — ABNORMAL HIGH (ref 70–99)
Glucose-Capillary: 168 mg/dL — ABNORMAL HIGH (ref 70–99)
Glucose-Capillary: 220 mg/dL — ABNORMAL HIGH (ref 70–99)

## 2018-10-15 MED ORDER — INSULIN DETEMIR 100 UNIT/ML ~~LOC~~ SOLN
35.0000 [IU] | Freq: Two times a day (BID) | SUBCUTANEOUS | Status: DC
  Administered 2018-10-15 – 2018-10-19 (×9): 35 [IU] via SUBCUTANEOUS
  Filled 2018-10-15 (×11): qty 0.35

## 2018-10-15 MED ORDER — INSULIN DETEMIR 100 UNIT/ML ~~LOC~~ SOLN: 36.0000 [IU] | Freq: Two times a day (BID) | SUBCUTANEOUS | Status: DC

## 2018-10-15 MED ORDER — INSULIN DETEMIR 100 UNIT/ML ~~LOC~~ SOLN
35.0000 [IU] | Freq: Two times a day (BID) | SUBCUTANEOUS | Status: DC
  Filled 2018-10-15 (×2): qty 0.35

## 2018-10-15 NOTE — Progress Notes (Signed)
Social Work Patient ID: Angela Booth, female   DOB: [birth year], 53 y.o.   MRN: [id number] Met with pt to discuss family education with husband and daughter. She reports they can come Friday at 1:00-3:00 pm. Will schedule with therapy team. Awaiting equipment needs. Pt wants to discharge early on Sat which can be accommodated.

## 2018-10-15 NOTE — Progress Notes (Signed)
Occupational Therapy Session Note  Patient Details  Name: Angela Booth MRN: [id number] Date of Birth: [birth year]  Today's Date: 10/15/2018 OT Individual Time: 1006-1100 OT Individual Time Calculation (min): 54 min    Short Term Goals: Week 2:  OT Short Term Goal 1 (Week 2): STGs = LTGs  Skilled Therapeutic Interventions/Progress Updates:    Treatment session with focus on functional mobility and BUE strengthening as needed for functional transfers and ADLs.  Pt received supine in bed reporting not sleeping well last night and therefore fatigued, but willing to engage in therapy session as planned.  Pt completed bed mobility with heavy reliance on bed rail (pt will not have rail at home) with supervision.  Completed stand pivot transfer with PFRW with CGA to [facility].  Pt completed toileting and hygiene with CGA.  Therapist setup room to simulate accessing bathroom at home.  Pt ambulated 7-8' with PFRW with CGA to w/c to simulate distance to toilet in home.  Pt completed 2 90* turns with CGA during transfers.  Completed simulated toilet transfer with ambulation x3 with CGA.  Transferred back to EOB with CGA and engaged in BUE strengthening and ROM with 1# dowel rod while completing chest presses and lateral rows.  Returned to supine in bed with assist to manage LLE up in to bed.  Therapy Documentation Precautions:  Precautions Precautions: Fall Required Braces or Orthoses: Other Brace Other Brace: hinged knee brace on L LE  Restrictions Weight Bearing Restrictions: Yes LUE Weight Bearing: Weight bearing as tolerated LLE Weight Bearing: Non weight bearing Other Position/Activity Restrictions: hinged knee brace locked in ext at night can be unlocked during day for ROM Pain: Pain Assessment Pain Score: 4    Therapy/Group: Individual Therapy  Rosalio Loud 10/15/2018, 11:44 AM

## 2018-10-15 NOTE — Progress Notes (Signed)
Occupational Therapy Session Note  Patient Details  Name: Angela Booth MRN: [id number] Date of Birth: 1965/12/02  Today's Date: 10/15/2018 OT Individual Time: 6503-5465 OT Individual Time Calculation (min): 55 min    Short Term Goals: Week 2:  OT Short Term Goal 1 (Week 2): STGs = LTGs  Skilled Therapeutic Interventions/Progress Updates:    Pt resting in bed upon arrival.  OT intervention with focus on bed mobility, sit<>stand, functional transfers, toileting, discharge planning, activity tolerance, and safety awareness to increase independence with BADLs.  Pt requested to use BSC.  Pt requires min A for supine>sit EOB to move LLE to EOB.  Pt performs sit<>stand with supervision and performs stand pivot transfer with PFRW to [facility].  Pt performs hygiene without assistance and is currently wearing long shirt/dress with no underwear to facilitate dressing and toileting.  Pt performed sit<>stand X 5 with PFRW.  Pt propelled to gym and enaged in BUE therex on [employer] for 5 mins to facilitate increased LUE shoulder mobility.  Pt propelled back to room and amb approx 5' to bed and sat EOB.  Pt requires mod A for sit>supine in bed to lift LLE onto bed.  Pt able to reposition in bed without assistance.  Pt remained in bed with all needs within reach and bed alarm activated.   Therapy Documentation Precautions:  Precautions Precautions: Fall Required Braces or Orthoses: Other Brace Other Brace: hinged knee brace on L LE  Restrictions Weight Bearing Restrictions: Yes LUE Weight Bearing: Weight bearing as tolerated LLE Weight Bearing: Non weight bearing Other Position/Activity Restrictions: hinged knee brace locked in ext at night can be unlocked during day for ROM Pain:  Pt c/o 4/10 pain in LLE; repositioned and Bledsoe brace repositioned   Therapy/Group: Individual Therapy  Rich Brave 10/15/2018, 2:32 PM

## 2018-10-15 NOTE — Progress Notes (Addendum)
Physical Therapy Weekly Progress Note  Patient Details  Name: Angela Booth MRN: [id number] Date of Birth: 03-22-1966  Beginning of progress report period: 10/06/18 End of progress report period: 10/15/18  Today's Date: 10/15/2018 PT Individual Time: 1450-1615 PT Individual Time Calculation (min): 85 min   Patient has met 3 of 4 short term goals.  She partly met the w/c propulsion goal, but distance is limited by LUE pain/weakness.  Patient continues to demonstrate the following deficits muscle weakness and muscle joint tightness, decreased cardiorespiratoy endurance and decreased standing balance and decreased balance strategies and therefore will continue to benefit from skilled PT intervention to increase functional independence with mobility.  Patient progressing toward long term goals..  Continue plan of care.  PT Short Term Goals Week 1:  PT Short Term Goal 1 (Week 1): Pt will perform bed mobility with mod assist  PT Short Term Goal 1 - Progress (Week 1): Met PT Short Term Goal 2 (Week 1): Pt will performed wc<>bed transfer with mod assist consistently  PT Short Term Goal 2 - Progress (Week 1): Met PT Short Term Goal 3 (Week 1): Pt will perform sit<>stand with max assist at RW  PT Short Term Goal 3 - Progress (Week 1): Met PT Short Term Goal 4 (Week 1): Pt will prollel WC 167f with min assist  PT Short Term Goal 4 - Progress (Week 1): Partly met Week 2: = LTGs due to ELOS    Skilled Therapeutic Interventions/Progress Updates:   Pt resting in bed.  PT shortened straps of knee brace and continued ed with pt for proper positioning, as it slides down toward foot often.  Supine with HOB raised, R straight leg raises x 15.  Bed mobility with bed features, for supine> sit to R with min assist for LLE.  In sitting, pt used reacher to don shorts with superivsion.  Sit> stand at bil PFRW and pivot to w/c with CGA.  Gait training bil PFRW x 8' including turns, CGA, cues to bring RW  with her as she turns.  Pt continues to need cues to lock/unlock w/c brakes.  She is able to manipulate R footrest with cues, but needs min assist for L ELR while managing LLE.  Simulated car transfer to sedan height seat with PFRW, min assist. Pt unable to bring LLE onto floor board, even scooting as far back as possible on seat.  Limited by LUE weakness and pain.  Pt will have her husband measure car seat height.   Stand pivot to firm mat: supervision sit> supine.  Rolling R with supervision.  Min assist to manage LLE sit up as pt unable to tolerate posiiton of LLE in side lying.  Pt returned to bed at end of session.  Pt left resting in bed with needs at hand and bed alarm set.     Therapy Documentation Precautions:  Precautions Precautions: Fall Required Braces or Orthoses: Other Brace Other Brace: hinged knee brace on L LE  Restrictions Weight Bearing Restrictions: Yes LUE Weight Bearing: Weight bearing as tolerated LLE Weight Bearing: Non weight bearing Other Position/Activity Restrictions: hinged knee brace locked in ext at night can be unlocked during day for ROM  Pain: pt denies       Therapy/Group: Individual Therapy  Mikail Goostree 10/15/2018, 4:21 PM

## 2018-10-15 NOTE — Progress Notes (Signed)
[facility] PHYSICAL MEDICINE & REHABILITATION PROGRESS NOTE  Subjective/Complaints: Patient seen sitting up in her chair this morning.  She states she did not sleep well overnight because her brace kept sliding down.  Otherwise, she notes that she had a good weekend.  Husband is on the phone and has questions regarding timing of discharge.  ROS: Denies CP, SOB, N/V/D  Objective: Vital Signs: Blood pressure 112/67, pulse (!) 103, temperature 98 F (36.7 C), resp. rate 20, height 5\' 3"  (1.6 m), weight 103.3 kg, SpO2 99 %. No results found. No results for input(s): WBC, HGB, HCT, PLT in the last 72 hours. No results for input(s): NA, K, CL, CO2, GLUCOSE, BUN, CREATININE, CALCIUM in the last 72 hours.  Physical Exam: BP 112/67 (BP Location: Right Arm)   Pulse (!) 103   Temp 98 F (36.7 C)   Resp 20   Ht 5\' 3"  (1.6 m)   Wt 103.3 kg   SpO2 99%   BMI 40.34 kg/m  Constitutional: No distress . Vital signs reviewed. HENT: Normocephalic.  Atraumatic. Eyes: EOMI. No discharge. Cardiovascular: No JVD. Respiratory: Normal effort. GI: Non-distended. Musc: Left-sided edema and tenderness Neurological: She is alert and oriented Motor:  LUE: Shoulder abduction, elbow flex/ext 3 +-4-/5, hand grip 4--4/5 LLE: HF 3/5, knee in brace, ADF 4+5 Skin:  Scattered abrasions LUE and LLE Left arm incision c/d/i  Psychiatric: She has a normal mood and affect. Her behavior is normal.   Assessment/Plan: 1. Functional deficits secondary to multi-ortho which require 3+ hours per day of interdisciplinary therapy in a comprehensive inpatient rehab setting.  Physiatrist is providing close team supervision and 24 hour management of active medical problems listed below.  Physiatrist and rehab team continue to assess barriers to discharge/monitor patient progress toward functional and medical goals  Care Tool:  Bathing    Body parts bathed by patient: Left arm, Chest, Abdomen, Front perineal area, Right  upper leg, Face, Right arm   Body parts bathed by helper: Buttocks, Right lower leg Body parts n/a: Left upper leg, Left lower leg   Bathing assist Assist Level: Moderate Assistance - Patient 50 - 74%     Upper Body Dressing/Undressing Upper body dressing   What is the patient wearing?: Dress    Upper body assist Assist Level: Supervision/Verbal cueing    Lower Body Dressing/Undressing Lower body dressing    Lower body dressing activity did not occur: N/A(used a dress) What is the patient wearing?: Pants, Underwear/pull up     Lower body assist Assist for lower body dressing: Contact Guard/Touching assist(Using reacher)     Toileting Toileting Toileting Activity did not occur (Clothing management and hygiene only): Safety/medical concerns  Toileting assist Assist for toileting: Contact Guard/Touching assist     Transfers Chair/bed transfer  Transfers assist     Chair/bed transfer assist level: Minimal Assistance - Patient > 75%     Locomotion Ambulation   Ambulation assist      Assist level: Contact Guard/Touching assist Assistive device: Walker-platform Max distance: 10   Walk 10 feet activity   Assist  Walk 10 feet activity did not occur: Safety/medical concerns  Assist level: Minimal Assistance - Patient > 75% Assistive device: Walker-platform   Walk 50 feet activity   Assist Walk 50 feet with 2 turns activity did not occur: Safety/medical concerns         Walk 150 feet activity   Assist Walk 150 feet activity did not occur: Safety/medical concerns  Walk 10 feet on uneven surface  activity   Assist Walk 10 feet on uneven surfaces activity did not occur: Safety/medical concerns         Wheelchair     Assist Will patient use wheelchair at discharge?: Yes Type of Wheelchair: Manual    Wheelchair assist level: Supervision/Verbal cueing Max wheelchair distance: 7140'    Wheelchair 50 feet with 2 turns  activity    Assist    Wheelchair 50 feet with 2 turns activity did not occur: Refused       Wheelchair 150 feet activity     Assist Wheelchair 150 feet activity did not occur: Safety/medical concerns          Medical Problem List and Plan: 1.  Deficits with mobility, transfers, endurance, self-care secondary to multi-Ortho.  Cont CIR 2.  Antithrombotics: -DVT/anticoagulation:  Pharmaceutical: Lovenox             -antiplatelet therapy: N/A 3. Pain Management: MSIR PRN for severe pain, decreased to 7.5 every 6 as needed on 4/17             Relatively controlled on 4/20 4. Mood: LCSW to follow for evaluation and support             -antipsychotic agents: N/A  5. Neuropsych: This patient is capable of making decisions on her own behalf. 6. Skin/Wound Care: Monitor wound daily for healing.   Added protein supplements due to low calorie malnutrition.  7. Fluids/Electrolytes/Nutrition: Routine I/Os.  8. ABLA: Added iron supplement.   Hemoglobin 8.7 on 4/17  Continue to monitor 9.T2DM poorly controlled: Hgb A1c- 10.0--continues to be poorly controlled  Increased Levemir to 23 units twice daily on 4/13, increased to 25 BID on 4/15, increased to 28 on 4/16, increased to 3 twice daily on 4/18, increased to 35 twice daily on 4/20  SSI for elevated BS.  10.  Morbid obesity: Educated patient on weight loss to help promote health as well as mobility.    Consulted dietitian for dietary education 11.  Hypokalemia  Potassium 4.3 on 4/17 after supplementation  Continue to monitor  LOS: 10 days A FACE TO FACE EVALUATION WAS PERFORMED  Lilya Smitherman Karis Jubanil Torrie Lafavor 10/15/2018, 8:54 AM

## 2018-10-15 NOTE — Progress Notes (Signed)
Occupational Therapy Session Note  Patient Details  Name: Angela Booth MRN: [id number] Date of Birth: 22-Aug-1965  Today's Date: 10/15/2018 OT Individual Time: 4801-6553 OT Individual Time Calculation (min): 27 min    Short Term Goals: Week 2:  OT Short Term Goal 1 (Week 2): STGs = LTGs  Skilled Therapeutic Interventions/Progress Updates:    Treatment session with focus on functional transfers, sit <> stand, and d/c planning.  Pt received upright in w/c reporting already washed up.  Engaged in discussion regarding pt goals and progress towards goals.  Discussed bathroom setup at daughter's home and plan to ambulate in to bathroom with PFRW.  Engaged in sit > stand x5 progressing from min assist to CGA, declined any lightheadedness/dizziness.  Pt able to maintain standing to simulate LB dressing and/or hygiene during standing balance.  Transferred w/c > BSC with PFRW with CGA and left seated on BSC with all needs in reach.  Notified nurse tech of pt position.  Therapy Documentation Precautions:  Precautions Precautions: Fall Required Braces or Orthoses: Other Brace Other Brace: hinged knee brace on L LE  Restrictions Weight Bearing Restrictions: Yes LUE Weight Bearing: Weight bearing as tolerated LLE Weight Bearing: Non weight bearing Other Position/Activity Restrictions: hinged knee brace locked in ext at night can be unlocked during day for ROM General:   Vital Signs: Therapy Vitals Pulse Rate: (!) 103 Resp: 20 BP: 112/67 Patient Position (if appropriate): Lying Oxygen Therapy SpO2: 99 % O2 Device: Room Air Pain: Pain Assessment Pain Scale: 0-10 Pain Score: 5  Pain Type: Surgical pain Pain Location: Knee Pain Orientation: Left Pain Descriptors / Indicators: Aching;Discomfort Pain Intervention(s): Medication (See eMAR)   Therapy/Group: Individual Therapy  Rosalio Loud 10/15/2018, 8:24 AM

## 2018-10-16 ENCOUNTER — Inpatient Hospital Stay (HOSPITAL_COMMUNITY): Payer: Self-pay | Admitting: Physical Therapy

## 2018-10-16 ENCOUNTER — Inpatient Hospital Stay (HOSPITAL_COMMUNITY): Payer: Self-pay

## 2018-10-16 ENCOUNTER — Inpatient Hospital Stay (HOSPITAL_COMMUNITY): Payer: Self-pay | Admitting: Occupational Therapy

## 2018-10-16 LAB — GLUCOSE, CAPILLARY
Glucose-Capillary: 184 mg/dL — ABNORMAL HIGH (ref 70–99)
Glucose-Capillary: 165 mg/dL — ABNORMAL HIGH (ref 70–99)
Glucose-Capillary: 131 mg/dL — ABNORMAL HIGH (ref 70–99)
Glucose-Capillary: 155 mg/dL — ABNORMAL HIGH (ref 70–99)
Glucose-Capillary: 243 mg/dL — ABNORMAL HIGH (ref 70–99)

## 2018-10-16 NOTE — Progress Notes (Signed)
[facility] PHYSICAL MEDICINE & REHABILITATION PROGRESS NOTE  Subjective/Complaints: Patient seen sitting up this morning.  She states she slept well overnight. She notes braces not elevating despite adjustments.  Discussed with nursing-we will follow-up with orthotist.  ROS: Denies CP, SOB, N/V/D  Objective: Vital Signs: Blood pressure 122/70, pulse 99, temperature 98 F (36.7 C), temperature source Oral, resp. rate 18, height 5\' 3"  (1.6 m), weight 101.5 kg, SpO2 99 %. No results found. No results for input(s): WBC, HGB, HCT, PLT in the last 72 hours. No results for input(s): NA, K, CL, CO2, GLUCOSE, BUN, CREATININE, CALCIUM in the last 72 hours.  Physical Exam: BP 122/70 (BP Location: Right Arm)   Pulse 99   Temp 98 F (36.7 C) (Oral)   Resp 18   Ht 5\' 3"  (1.6 m)   Wt 101.5 kg   SpO2 99%   BMI 39.64 kg/m  Constitutional: No distress . Vital signs reviewed. HENT: Normocephalic.  Atraumatic.. Eyes: EOMI.  No discharge. Cardiovascular: No JVD. Respiratory: Normal effort. GI: Non-distended. Musc: Left-sided edema and tenderness Neurological: She is alert and oriented Motor:  LUE: Shoulder abduction, elbow flex/ext 3 +-4-/5, hand grip 4--4/5 LLE: HF 3/5, knee in brace, ADF 4+5, stable Skin:  Scattered abrasions LUE and LLE Left arm incision c/d/i  Psychiatric: She has a normal mood and affect. Her behavior is normal.   Assessment/Plan: 1. Functional deficits secondary to multi-ortho which require 3+ hours per day of interdisciplinary therapy in a comprehensive inpatient rehab setting.  Physiatrist is providing close team supervision and 24 hour management of active medical problems listed below.  Physiatrist and rehab team continue to assess barriers to discharge/monitor patient progress toward functional and medical goals  Care Tool:  Bathing    Body parts bathed by patient: Left arm, Chest, Abdomen, Front perineal area, Right upper leg, Face, Right arm   Body  parts bathed by helper: Buttocks, Right lower leg Body parts n/a: Left upper leg, Left lower leg   Bathing assist Assist Level: Moderate Assistance - Patient 50 - 74%     Upper Body Dressing/Undressing Upper body dressing   What is the patient wearing?: Dress    Upper body assist Assist Level: Supervision/Verbal cueing    Lower Body Dressing/Undressing Lower body dressing    Lower body dressing activity did not occur: N/A(used a dress) What is the patient wearing?: Pants, Underwear/pull up     Lower body assist Assist for lower body dressing: Contact Guard/Touching assist(Using reacher)     Toileting Toileting Toileting Activity did not occur (Clothing management and hygiene only): Safety/medical concerns  Toileting assist Assist for toileting: Contact Guard/Touching assist     Transfers Chair/bed transfer  Transfers assist     Chair/bed transfer assist level: Contact Guard/Touching assist     Locomotion Ambulation   Ambulation assist      Assist level: Contact Guard/Touching assist Assistive device: Walker-platform Max distance: 8   Walk 10 feet activity   Assist  Walk 10 feet activity did not occur: Safety/medical concerns  Assist level: Minimal Assistance - Patient > 75% Assistive device: Walker-platform   Walk 50 feet activity   Assist Walk 50 feet with 2 turns activity did not occur: Safety/medical concerns         Walk 150 feet activity   Assist Walk 150 feet activity did not occur: Safety/medical concerns         Walk 10 feet on uneven surface  activity   Assist Walk 10 feet  on uneven surfaces activity did not occur: Safety/medical concerns         Wheelchair     Assist Will patient use wheelchair at discharge?: Yes Type of Wheelchair: Manual    Wheelchair assist level: Supervision/Verbal cueing Max wheelchair distance: 72'    Wheelchair 50 feet with 2 turns activity    Assist    Wheelchair 50 feet with  2 turns activity did not occur: Refused       Wheelchair 150 feet activity     Assist Wheelchair 150 feet activity did not occur: Safety/medical concerns          Medical Problem List and Plan: 1.  Deficits with mobility, transfers, endurance, self-care secondary to multi-Ortho.  Cont CIR 2.  Antithrombotics: -DVT/anticoagulation:  Pharmaceutical: Lovenox             -antiplatelet therapy: N/A 3. Pain Management: MSIR PRN for severe pain, decreased to 7.5 every 6 as needed on 4/17             Relatively controlled on 4/21 4. Mood: LCSW to follow for evaluation and support             -antipsychotic agents: N/A  5. Neuropsych: This patient is capable of making decisions on her own behalf. 6. Skin/Wound Care: Monitor wound daily for healing.   Added protein supplements due to low calorie malnutrition.  7. Fluids/Electrolytes/Nutrition: Routine I/Os.  8. ABLA: Added iron supplement.   Hemoglobin 8.7 on 4/17  Continue to monitor 9.T2DM poorly controlled: Hgb A1c- 10.0.  Increased Levemir to 23 units twice daily on 4/13, increased to 25 BID on 4/15, increased to 28 on 4/16, increased to 3 twice daily on 4/18, increased to 35 twice daily on 4/20  Elevated on 4/21  SSI for elevated BS.  10.  Morbid obesity: Educated patient on weight loss to help promote health as well as mobility.    Consulted dietitian for dietary education 11.  Hypokalemia  Potassium 4.3 on 4/17 after supplementation  Continue to monitor  LOS: 11 days A FACE TO FACE EVALUATION WAS PERFORMED  Ankit Karis Juba 10/16/2018, 8:36 AM

## 2018-10-16 NOTE — Progress Notes (Addendum)
Physical Therapy Session Note  Patient Details  Name: Angela Booth MRN: [id number] Date of Birth: January 10, 1966  Today's Date: 10/16/2018 PT Individual Time: 0800-0917 PT Individual Time Calculation (min): 77 min   Short Term Goals: Week 1:  PT Short Term Goal 1 (Week 1): Pt will perform bed mobility with mod assist  PT Short Term Goal 1 - Progress (Week 1): Met PT Short Term Goal 2 (Week 1): Pt will performed wc<>bed transfer with mod assist consistently  PT Short Term Goal 2 - Progress (Week 1): Met PT Short Term Goal 3 (Week 1): Pt will perform sit<>stand with max assist at RW  PT Short Term Goal 3 - Progress (Week 1): Met PT Short Term Goal 4 (Week 1): Pt will prollel WC 138f with min assist  PT Short Term Goal 4 - Progress (Week 1): Partly met  Skilled Therapeutic Interventions/Progress Updates:     Patient in w/c at sink brushing her teeth upon PT arrival. Patient alert and agreeable to PT session. L Bledsoe brace was donned throughout session in the unlocked position. Adjusted x2 due to sliding down during transfers and ambulation.  Therapeutic Activity: Bed Mobility: Patient performed supine to sit x1 and sit to supine x2 in a flat bed without use of bed rails on the R side of the bed to simulate her home environment with supervision using a blue leg lifter to assist her L LE on and off the bed. Provided verbal cues for use of leg lifter and bringing feet off the bed before sitting up. Transfers: Patient performed stand pivot transfers x3 and sit to/from stand x1 with supervision-CGA for safety and balance using a B platform RW. Provided verbal cues for checking breaks on the w/c before standing. She performed a car transfer using the B platform RW with the seat height set to 22" to simulate the front passenger seat of her personal vehicle with min A for L LE managment. Educated on having the seat pushed all the way back and leaning to back of the seat as far back as possible to allow  ample room for L LE. Patient preferred this technique to attempting to scoot into back seat, attempted yesterday.   Gait Training:  Patient ambulated 30 feet using B platform RW with CGA for safety and balance. Ambulated with hop-to gait pattern on R with decreased step length and step height. Provided verbal cues for shoulder depression for improved use of B UE's.  Wheelchair Mobility:  Patient propelled wheelchair 55 feet with supervision with 3 90 degree turns x2. Provided verbal cues for turning technique for tight turns.  Therapeutic Exercise: Patient performed the following exercises with verbal and tactile cues for proper technique. -BAnkle pumps x20 -B Ankle rolls x20  -L heel slides x10 -B glut sets x10 with 5 second holds  Patient in bed at end of session with breaks locked, bed alarm set, and all needs within reach.    Therapy Documentation Precautions:  Precautions Precautions: Fall Required Braces or Orthoses: Other Brace Other Brace: hinged knee brace on L LE  Restrictions Weight Bearing Restrictions: Yes LUE Weight Bearing: Weight bearing as tolerated LLE Weight Bearing: Non weight bearing Other Position/Activity Restrictions: hinged knee brace locked in ext at night can be unlocked during day for ROM Pain: 3/10 constant, aching L Leg pain. Patient repositioned and distracted for pain intervention during session.    Therapy/Group: Individual Therapy  CDoreene Burke PT, DPT 10/16/2018, 3:52 PM

## 2018-10-16 NOTE — Plan of Care (Signed)
  Problem: Consults Goal: RH GENERAL PATIENT EDUCATION Description See Patient Education module for education specifics. Outcome: Progressing Goal: Skin Care Protocol Initiated - if Braden Score 18 or less Description If consults are not indicated, leave blank or document N/A Outcome: Progressing Goal: Nutrition Consult-if indicated Outcome: Progressing Goal: Diabetes Guidelines if Diabetic/Glucose > 140 Description If diabetic or lab glucose is > 140 mg/dl - Initiate Diabetes/Hyperglycemia Guidelines & Document Interventions  Outcome: Progressing   Problem: RH BOWEL ELIMINATION Goal: RH STG MANAGE BOWEL WITH ASSISTANCE Description STG Manage Bowel with min Assistance.  Outcome: Progressing Goal: RH STG MANAGE BOWEL W/MEDICATION W/ASSISTANCE Description STG Manage Bowel with Medication with minAssistance.  Outcome: Progressing   Problem: RH SKIN INTEGRITY Goal: RH STG SKIN FREE OF INFECTION/BREAKDOWN Description Free of skin infection or skin breakdown while in Rehab with min assist  Outcome: Progressing Goal: RH STG MAINTAIN SKIN INTEGRITY WITH ASSISTANCE Description STG Maintain Skin Integrity With min Assistance.  Outcome: Progressing Goal: RH STG ABLE TO PERFORM INCISION/WOUND CARE W/ASSISTANCE Description STG Able To Perform Incision/Wound Care With mod I Assistance.  Outcome: Progressing   Problem: RH SAFETY Goal: RH STG ADHERE TO SAFETY PRECAUTIONS W/ASSISTANCE/DEVICE Description STG Adhere to Safety Precautions With mod Assistance/Device.  Outcome: Progressing Goal: RH STG DECREASED RISK OF FALL WITH ASSISTANCE Description STG Decreased Risk of Fall With mod I Assistance.  Outcome: Progressing   Problem: RH PAIN MANAGEMENT Goal: RH STG PAIN MANAGED AT OR BELOW PT'S PAIN GOAL Description <4  Outcome: Progressing   Problem: RH KNOWLEDGE DEFICIT GENERAL Goal: RH STG INCREASE KNOWLEDGE OF SELF CARE AFTER HOSPITALIZATION Description Increase knowledge of  self care after hospital discharge with min assist.  Outcome: Progressing

## 2018-10-16 NOTE — Progress Notes (Signed)
Physical Therapy Session Note  Patient Details  Name: Angela Booth MRN: [id number] Date of Birth: [birth year]  Today's Date: 10/16/2018 PT Individual Time: 9767-3419 PT Individual Time Calculation (min): 46 min   Short Term Goals: Week 2:  PT Short Term Goal 1 (Week 2): = LTGs due to ELOS    Skilled Therapeutic Interventions/Progress Updates:   Pt received supine in bed and agreeable to therapy session. Pt wearing L LE hinge brace unlocked throughout session. Performed provided HEP doing 1 set of 15-20 repetitions of the following exercises with L LE:  - supine ankle DF/PF - supine hip adduction isometric hold - supine quadriceps isometric hold - supine heel slides with min assist for knee flexion to ~30degrees to pt's pain tolerance: pt educated on importance of maintaining knee flexion ROM during recovery - supine short arch quads with min assist for full knee extension - supine straight leg raise with mod assist for lifting; cuing for sustained quadriceps activation during exercise - supine hip abduction - standing with BUE support on B platform RW hip abduction - standing with BUE support on B platform RW hip extension  Cuing throughout for proper technique/form with pt demonstrating understanding. Pt educated on importance of performing HEP to decrease L LE muscle atrophy during healing process. Pt performed supine<>sit, HOB partially elevated and bed rails available, with supervision and increased time for L LE management but able to perform without physical assist. Pt performed sit<>stand from elevated EOB to B platform RW with CGA/supervision for steadying/safety and pt demonstrating ability to maintain L LE NWB precaution with minimal to no cuing. Pt left supine in bed with needs in reach and bed alarm on.   Therapy Documentation Precautions:  Precautions Precautions: Fall Required Braces or Orthoses: Other Brace Other Brace: hinged knee brace on L LE  Restrictions Weight  Bearing Restrictions: Yes LUE Weight Bearing: Weight bearing as tolerated LLE Weight Bearing: Non weight bearing Other Position/Activity Restrictions: hinged knee brace locked in ext at night can be unlocked during day for ROM  Pain: Reports L knee pain level 3/10 with rest breaks and repositioning provided throughout for pain management.   Therapy/Group: Individual Therapy  Ginny Forth, PT, DPT 10/16/2018, 3:38 PM
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
## 2018-10-17 ENCOUNTER — Inpatient Hospital Stay (HOSPITAL_COMMUNITY): Payer: Self-pay

## 2018-10-17 ENCOUNTER — Inpatient Hospital Stay (HOSPITAL_COMMUNITY): Payer: No Typology Code available for payment source

## 2018-10-17 ENCOUNTER — Inpatient Hospital Stay (HOSPITAL_COMMUNITY): Payer: Self-pay | Admitting: Occupational Therapy

## 2018-10-17 ENCOUNTER — Inpatient Hospital Stay (HOSPITAL_COMMUNITY): Payer: Self-pay | Admitting: *Deleted

## 2018-10-17 LAB — GLUCOSE, CAPILLARY
Glucose-Capillary: 150 mg/dL — ABNORMAL HIGH (ref 70–99)
Glucose-Capillary: 221 mg/dL — ABNORMAL HIGH (ref 70–99)
Glucose-Capillary: 147 mg/dL — ABNORMAL HIGH (ref 70–99)
Glucose-Capillary: 220 mg/dL — ABNORMAL HIGH (ref 70–99)
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
## 2018-10-17 NOTE — Progress Notes (Signed)
Occupational Therapy Session Note  Patient Details  Name: Angela Booth MRN: [id number] Date of Birth: Feb 03, 1966  Today's Date: 10/17/2018 OT Individual Time: 1035-1130 OT Individual Time Calculation (min): 55 min    Short Term Goals: Week 2:  OT Short Term Goal 1 (Week 2): STGs = LTGs  Skilled Therapeutic Interventions/Progress Updates:    Treatment session with focus on LUE gentle ROM and functional mobility.  Pt received upright in w/c reporting fatigue and pain in LLE, as she had not taken pain meds prior to PT session and now just feels "behind".  Discussed WBAT in LUE as awaiting further instructions regarding resistance training with UE.  Engaged in gentle AROM to LUE to simulate movements required to complete ADLs.  Pt reports continued "stiffness" in LUE, but is demonstrating increased ease with range to complete self-care tasks.  Pt ambulated 8' with RW with CGA to simulate functional mobility in home and access to bathroom.  Pt required increased time to recall need to lock w/c brakes and adjust leg rests.  Returned to bed and pt utilized left lifter to lift LLE in to bed, positioned LLE on pillows for comfort.  Pt left with all needs in reach.  Therapy Documentation Precautions:  Precautions Precautions: Fall Required Braces or Orthoses: Other Brace Other Brace: hinged knee brace on L LE  Restrictions Weight Bearing Restrictions: Yes LUE Weight Bearing: Weight bearing as tolerated LLE Weight Bearing: Non weight bearing Other Position/Activity Restrictions: hinged knee brace locked in ext at night can be unlocked during day for ROM  Pain:  Pt with c/o pain in Lt knee.  RN aware.   Therapy/Group: Individual Therapy  Rosalio Loud 10/17/2018, 12:00 PM
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
## 2018-10-17 NOTE — Progress Notes (Signed)
Ortho Trauma Progress Note  Doing well. No complaints. Pain controlled  LLE: Incisions healing well. Full extension. Sutures removed LUE: Incision healing well excellent elbow and shoulder ROM  Imaging: Pending  A/P s/p left tibial plateau ORIF and left humerus ORIF  WBAT LUE NWB LLE ROM for LLE Okay to remove brace at night, wear when up with therapy Repeat x-rays today Okay to shower from ortho perspective Follow up 4 weeks  Roby Lofts, MD [facility] [phone] (phone) [phone] (office) [email or web address]

## 2018-10-17 NOTE — Progress Notes (Signed)
[facility] PHYSICAL MEDICINE & REHABILITATION PROGRESS NOTE  Subjective/Complaints: Patient seen laying in bed this AM.  She states she slept well overnight.  She questions regarding follow up appointments.  She notes her brace was adjusted yesterday and feels better.   ROS: Denies CP, SOB, N/V/D  Objective: Vital Signs: Blood pressure 134/75, pulse 84, temperature 98.5 F (36.9 C), temperature source Oral, resp. rate 18, height 5\' 3"  (1.6 m), weight 101.5 kg, SpO2 100 %. No results found. No results for input(s): WBC, HGB, HCT, PLT in the last 72 hours. No results for input(s): NA, K, CL, CO2, GLUCOSE, BUN, CREATININE, CALCIUM in the last 72 hours.  Physical Exam: BP 134/75 (BP Location: Right Arm)   Pulse 84   Temp 98.5 F (36.9 C) (Oral)   Resp 18   Ht 5\' 3"  (1.6 m)   Wt 101.5 kg   SpO2 100%   BMI 39.64 kg/m  Constitutional: No distress . Vital signs reviewed. HENT: Normocephalic. Atraumatic. Eyes: EOMI. No discharge. Cardiovascular: No JVD. Respiratory: Normal effort. GI: Non-distended. Musc: Left-sided edema and tenderness Neurological: She is alert and oriented Motor:  LUE: Shoulder abduction, elbow flex/ext 4-/5, hand grip 4/5 LLE: HF 3+/5, knee in brace, ADF 4+5 Skin:  Scattered abrasions LUE and LLE healing Left arm incision c/d/i  Psychiatric: She has a normal mood and affect. Her behavior is normal.   Assessment/Plan: 1. Functional deficits secondary to multi-ortho which require 3+ hours per day of interdisciplinary therapy in a comprehensive inpatient rehab setting.  Physiatrist is providing close team supervision and 24 hour management of active medical problems listed below.  Physiatrist and rehab team continue to assess barriers to discharge/monitor patient progress toward functional and medical goals  Care Tool:  Bathing    Body parts bathed by patient: Left arm, Chest, Abdomen, Front perineal area, Right upper leg, Face, Right arm   Body parts  bathed by helper: Buttocks, Right lower leg Body parts n/a: Left upper leg, Left lower leg   Bathing assist Assist Level: Moderate Assistance - Patient 50 - 74%     Upper Body Dressing/Undressing Upper body dressing   What is the patient wearing?: Dress    Upper body assist Assist Level: Supervision/Verbal cueing    Lower Body Dressing/Undressing Lower body dressing    Lower body dressing activity did not occur: N/A(used a dress) What is the patient wearing?: Pants, Underwear/pull up     Lower body assist Assist for lower body dressing: Contact Guard/Touching assist(Using reacher)     Toileting Toileting Toileting Activity did not occur (Clothing management and hygiene only): Safety/medical concerns  Toileting assist Assist for toileting: Contact Guard/Touching assist     Transfers Chair/bed transfer  Transfers assist     Chair/bed transfer assist level: Contact Guard/Touching assist     Locomotion Ambulation   Ambulation assist      Assist level: Contact Guard/Touching assist Assistive device: Walker-platform Max distance: 30   Walk 10 feet activity   Assist  Walk 10 feet activity did not occur: Safety/medical concerns  Assist level: Contact Guard/Touching assist Assistive device: Walker-rolling   Walk 50 feet activity   Assist Walk 50 feet with 2 turns activity did not occur: Safety/medical concerns         Walk 150 feet activity   Assist Walk 150 feet activity did not occur: Safety/medical concerns         Walk 10 feet on uneven surface  activity   Assist Walk 10 feet on  uneven surfaces activity did not occur: Safety/medical concerns         Wheelchair     Assist Will patient use wheelchair at discharge?: Yes Type of Wheelchair: Manual    Wheelchair assist level: Supervision/Verbal cueing Max wheelchair distance: 63'    Wheelchair 50 feet with 2 turns activity    Assist    Wheelchair 50 feet with 2 turns  activity did not occur: Refused   Assist Level: Supervision/Verbal cueing   Wheelchair 150 feet activity     Assist Wheelchair 150 feet activity did not occur: Safety/medical concerns          Medical Problem List and Plan: 1.  Deficits with mobility, transfers, endurance, self-care secondary to multi-Ortho.  Cont CIR  Team conference today to discuss current and goals and coordination of care, home and environmental barriers, and discharge planning with nursing, case manager, and therapies.  2.  Antithrombotics: -DVT/anticoagulation:  Pharmaceutical: Lovenox             -antiplatelet therapy: N/A 3. Pain Management: MSIR PRN for severe pain, decreased to 7.5 every 6 as needed on 4/17  Controlled on 4/22 4. Mood: LCSW to follow for evaluation and support             -antipsychotic agents: N/A  5. Neuropsych: This patient is capable of making decisions on her own behalf. 6. Skin/Wound Care: Monitor wound daily for healing.   Added protein supplements due to low calorie malnutrition.  7. Fluids/Electrolytes/Nutrition: Routine I/Os.  8. ABLA: Added iron supplement.   Hemoglobin 8.7 on 4/17  Continue to monitor 9.T2DM poorly controlled: Hgb A1c- 10.0.  Increased Levemir to 23 units twice daily on 4/13, increased to 25 BID on 4/15, increased to 28 on 4/16, increased to 3 twice daily on 4/18, increased to 35 twice daily on 4/20  Labile on 4/22, monitor for trend  SSI for elevated BS.  10.  Morbid obesity: Educated patient on weight loss to help promote health as well as mobility.    Consulted dietitian for dietary education 11.  Hypokalemia  Potassium 4.3 on 4/17 after supplementation  Continue to monitor  LOS: 12 days A FACE TO FACE EVALUATION WAS PERFORMED   Karis Juba 10/17/2018, 8:23 AM

## 2018-10-17 NOTE — Progress Notes (Addendum)
Physical Therapy Session Note  Patient Details  Name: Angela Booth MRN: [id number] Date of Birth: 12/12/1965  Today's Date: 10/17/2018 PT Individual Time: 0800-0900 and 1515-1610 PT Individual Time Calculation (min): 60 min and 55 min  Short Term Goals:  Week 2:  PT Short Term Goal 1 (Week 2): = LTGs due to ELOS        Skilled Therapeutic Interventions/Progress Updates:   tx 1:  Pt sitting up in w/c, dressed.  No pain at rest. Pt stated she had not had any pain meds.  Pt' s knee brace was adjusted and padding added at ankle by [employer] yesterday.  Pt assessed need to re-position brace with 1 visual cues.   Min assist to reposition due to difficulty reaching lower 2 straps.  W/c propulsion x 100' with bil UEs with supervision, cues for efficiency and turns.  Distance limited by LUE fatigue. Pt able to place R foot rest off/on w/c with set up.  She requires total assist for L ELR.   Simulated car transfer to 22" high seat, as per husband's car.  Stand pivot with PFRW with min assist for LLE.  Standing with bil UE support on counter:  Therapeutic exercises performed with LE to increase strength for functional mobility: 10 x 1 L hip extension with extended knee, 5 x 1 L hip abduction.  Seated : 10 x 1 R straight leg raises. 2 reps L AAROM knee flexio/extension.  Pt unable to tolerate further movement of LLE; pain rated 8/10 L knee.  Use of hand out and cues, demo for education regarding HEP.  PT educated pt about pain meds before ex, here at CIR as well as at home.  Pt left resting in w/c with needs at hand.  tx 2:  Pt resting in bed.  She rated pain 4/10 L knee , premedicated.  Pt able to move into long sitting in flat bed, no rails.  She was able to fasten top 2 straps of brace, and direct care with mod cues for bottom 2 straps.  Ankle strap has additional padding that must be held by regular ankle strap. Long sitting> sitting EOB using leg lifter and cues to scoot hips side to side  to move forward.   Sit> stand at sink with min/mod assist due to LUE fatigue.    In standing, using hand out and cues, pt performed 10 x 1 each L hip extension, L hip abduction.  She stood on a 1/2" high board to improve clearnance of LLE.  Pt able to direct care for L knee brace with mod cues.  Pt left resting in w/c with needs at hand.  Most of pt's LTGs upgraded to reflect her progress; w/c distance downgraded due to LUE weakness.   Therapy Documentation Precautions:  Precautions Precautions: Fall Required Braces or Orthoses: Other Brace Other Brace: hinged knee brace on L LE  Restrictions Weight Bearing Restrictions: Yes LUE Weight Bearing: Weight bearing as tolerated LLE Weight Bearing: Non weight bearing Other Position/Activity Restrictions: hinged knee brace locked in ext at night can be unlocked during day for ROM      Therapy/Group: Individual Therapy  Amyrah Pinkhasov 10/17/2018, 10:14 AM

## 2018-10-17 NOTE — Progress Notes (Signed)
Occupational Therapy Session Note  Patient Details  Name: Angela Booth MRN: [id number] Date of Birth: 1965-10-17  Today's Date: 10/17/2018 OT Individual Time: 6144-3154 OT Individual Time Calculation (min): 55 min    Short Term Goals: Week 2:  OT Short Term Goal 1 (Week 2): STGs = LTGs  Skilled Therapeutic Interventions/Progress Updates:    Pt resting in bed upon arrival.  OT intervention with focus on bed mobility, discharge planning, BUE therex, and activity tolerance to increase independence with BADLs.  Supine>sit EOB with supervision using leg lifter. Pt engaged in BUE therex with 1# bar with focus on increased AROM of LUE.  Pt able to attain AROM [facility] with 1# bar. Pt c/o increased pain with continued therex and activitiy ceased.  Discussed DME needs; wide drop arm BSC and tub bench ordered. Pt pleased with progress and looking forward to returning home Saturday.  Pt remained in bed with all needs within reach and bed alarm activated.   Therapy Documentation Precautions:  Precautions Precautions: Fall Required Braces or Orthoses: Other Brace Other Brace: hinged knee brace on L LE  Restrictions Weight Bearing Restrictions: Yes LUE Weight Bearing: Weight bearing as tolerated LLE Weight Bearing: Non weight bearing Other Position/Activity Restrictions: hinged knee brace locked in ext at night can be unlocked during day for ROM    Pain:  Pt c/o increased pain in L shoulder with activity; repositioned, rest   Therapy/Group: Individual Therapy  Rich Brave 10/17/2018, 2:43 PM

## 2018-10-18 ENCOUNTER — Inpatient Hospital Stay (HOSPITAL_COMMUNITY): Payer: Self-pay

## 2018-10-18 ENCOUNTER — Inpatient Hospital Stay (HOSPITAL_COMMUNITY): Payer: Self-pay | Admitting: Occupational Therapy

## 2018-10-18 DIAGNOSIS — S82142A Displaced bicondylar fracture of left tibia, initial encounter for closed fracture: Secondary | ICD-10-CM

## 2018-10-18 LAB — GLUCOSE, CAPILLARY
Glucose-Capillary: 134 mg/dL — ABNORMAL HIGH (ref 70–99)
Glucose-Capillary: 146 mg/dL — ABNORMAL HIGH (ref 70–99)
Glucose-Capillary: 148 mg/dL — ABNORMAL HIGH (ref 70–99)
Glucose-Capillary: 179 mg/dL — ABNORMAL HIGH (ref 70–99)

## 2018-10-18 NOTE — Progress Notes (Signed)
[place] PHYSICAL MEDICINE & REHABILITATION PROGRESS NOTE  Subjective/Complaints: Patient seen laying in bed this morning.  States she slept well overnight.  She states she was seen by Dr. Jena Gauss and her brace was discontinued at bedtime.  ROS: Denies CP, SOB, N/V/D  Objective: Vital Signs: Blood pressure 118/73, pulse 90, temperature 98.3 F (36.8 C), temperature source Oral, resp. rate 17, height 5\' 3"  (1.6 m), weight 101.8 kg, SpO2 100 %. Dg Knee 1-2 Views Left  Result Date: 10/17/2018 CLINICAL DATA:  Closed bicondylar fracture of left tibial plateau. EXAM: LEFT KNEE - 1-2 VIEW COMPARISON:  None. FINDINGS: Status post surgical internal fixation of proximal left tibia. Good alignment of fracture components is noted. No other fracture or bony abnormality is noted. No significant joint effusion is noted. IMPRESSION: Status post surgical internal fixation of proximal left tibia. Electronically Signed   By: Lupita Raider M.D.   On: 10/17/2018 14:40   Dg Humerus Left  Result Date: 10/17/2018 CLINICAL DATA:  53 year old female with a fracture EXAM: LEFT HUMERUS - 2+ VIEW COMPARISON:  None. FINDINGS: Surgical changes of open reduction internal fixation of humeral fracture with plate and screw fixation. Small fracture fragments present. Anatomic alignment maintained. No significant soft tissue swelling. IMPRESSION: Postsurgical changes of ORIF of left humerus fracture with plate and screw fixation and no significant callus formation/remodeling. Electronically Signed   By: Gilmer Mor D.O.   On: 10/17/2018 14:41   No results for input(s): WBC, HGB, HCT, PLT in the last 72 hours. No results for input(s): NA, K, CL, CO2, GLUCOSE, BUN, CREATININE, CALCIUM in the last 72 hours.  Physical Exam: BP 118/73 (BP Location: Right Arm)   Pulse 90   Temp 98.3 F (36.8 C) (Oral)   Resp 17   Ht 5\' 3"  (1.6 m)   Wt 101.8 kg   SpO2 100%   BMI 39.76 kg/m  Constitutional: No distress . Vital signs  reviewed. HENT: Normocephalic.  Atraumatic. Eyes: EOMI.  No discharge. Cardiovascular: No JVD. Respiratory: Normal effort. GI: Non-distended. Musc: Left-sided edema and tenderness Neurological: She is alert and oriented Motor:  LUE: Shoulder abduction, elbow flex/ext 4-4+/5, hand grip 4+/5 LLE: HF 3+/5, knee in brace, ADF 4+5, improving Skin:  Scattered abrasions/incisions LUE and LLE healing Left arm incision c/d/i  Psychiatric: She has a normal mood and affect. Her behavior is normal.   Assessment/Plan: 1. Functional deficits secondary to multi-ortho which require 3+ hours per day of interdisciplinary therapy in a comprehensive inpatient rehab setting.  Physiatrist is providing close team supervision and 24 hour management of active medical problems listed below.  Physiatrist and rehab team continue to assess barriers to discharge/monitor patient progress toward functional and medical goals  Care Tool:  Bathing    Body parts bathed by patient: Left arm, Chest, Abdomen, Front perineal area, Right upper leg, Face, Right arm, Buttocks, Right lower leg, Left upper leg   Body parts bathed by helper: Left lower leg Body parts n/a: Left upper leg, Left lower leg   Bathing assist Assist Level: Minimal Assistance - Patient > 75%     Upper Body Dressing/Undressing Upper body dressing   What is the patient wearing?: Pull over shirt    Upper body assist Assist Level: Set up assist    Lower Body Dressing/Undressing Lower body dressing    Lower body dressing activity did not occur: N/A(used a dress) What is the patient wearing?: Pants     Lower body assist Assist for lower body  dressing: Minimal Assistance - Patient > 75%     Toileting Toileting Toileting Activity did not occur Press photographer(Clothing management and hygiene only): Safety/medical concerns  Toileting assist Assist for toileting: Supervision/Verbal cueing     Transfers Chair/bed transfer  Transfers assist      Chair/bed transfer assist level: Supervision/Verbal cueing     Locomotion Ambulation   Ambulation assist      Assist level: Contact Guard/Touching assist Assistive device: Walker-platform Max distance: 20   Walk 10 feet activity   Assist  Walk 10 feet activity did not occur: Safety/medical concerns  Assist level: Contact Guard/Touching assist Assistive device: Walker-rolling   Walk 50 feet activity   Assist Walk 50 feet with 2 turns activity did not occur: Safety/medical concerns         Walk 150 feet activity   Assist Walk 150 feet activity did not occur: Safety/medical concerns         Walk 10 feet on uneven surface  activity   Assist Walk 10 feet on uneven surfaces activity did not occur: Safety/medical concerns         Wheelchair     Assist Will patient use wheelchair at discharge?: Yes Type of Wheelchair: Manual    Wheelchair assist level: Supervision/Verbal cueing Max wheelchair distance: 100    Wheelchair 50 feet with 2 turns activity    Assist    Wheelchair 50 feet with 2 turns activity did not occur: Refused   Assist Level: Supervision/Verbal cueing   Wheelchair 150 feet activity     Assist Wheelchair 150 feet activity did not occur: Safety/medical concerns          Medical Problem List and Plan: 1.  Deficits with mobility, transfers, endurance, self-care secondary to multi-Ortho.  Cont CIR  Plan for d/c tomorrow  Will see patient for transitional care management in 1-2 weeks post-discharge 2.  Antithrombotics: -DVT/anticoagulation:  Pharmaceutical: Lovenox             -antiplatelet therapy: N/A 3. Pain Management: MSIR PRN for severe pain, decreased to 7.5 every 6 as needed on 4/17  Controlled on 4/23 4. Mood: LCSW to follow for evaluation and support             -antipsychotic agents: N/A  5. Neuropsych: This patient is capable of making decisions on her own behalf. 6. Skin/Wound Care: Monitor wound  daily for healing.   Added protein supplements due to low calorie malnutrition.  7. Fluids/Electrolytes/Nutrition: Routine I/Os.  8. ABLA: Added iron supplement.   Hemoglobin 8.7 on 4/17  Continue to monitor 9.T2DM poorly controlled: Hgb A1c- 10.0.  Increased Levemir to 23 units twice daily on 4/13, increased to 25 BID on 4/15, increased to 28 on 4/16, increased to 3 twice daily on 4/18, increased to 35 twice daily on 4/20  Labile, but improving on 4/23  Will continue to require ambulatory monitoring with adjustments, consider mealtime coverage as outpatient  SSI for elevated BS.  10.  Morbid obesity: Educated patient on weight loss to help promote health as well as mobility.    Consulted dietitian for dietary education 11.  Hypokalemia  Potassium 4.3 on 4/17 after supplementation  Continue to monitor  LOS: 13 days A FACE TO FACE EVALUATION WAS PERFORMED  Alpheus Stiff Karis Jubanil Tannor Pyon 10/18/2018, 11:39 AM

## 2018-10-18 NOTE — Progress Notes (Signed)
Occupational Therapy Session Note  Patient Details  Name: Angela Booth MRN: [id number] Date of Birth: [birth year]  Today's Date: 10/18/2018 OT Individual Time: 8902-2840 OT Individual Time Calculation (min): 75 min    Short Term Goals: Week 2:  OT Short Term Goal 1 (Week 2): STGs = LTGs  Skilled Therapeutic Interventions/Progress Updates:    Pt seen for ADL training to include shower. Pt was able to stand to [place] and ambulate to bathroom with S using regular BSC over toilet.  Pt was able to toilet with S and then transferred to tub bench in shower with CGA.  In shower, pt propped L foot onto bench for support. Brace removed.  Pt bathed self except for A with L foot.   She dried off, donned shirt (s/u) and brace (min) and then ambulated back to w/c.  She began to don pants but then realized the fabric was rubbing on her knee scab so she donned shorts with reacher needing min A over L foot as clothing kept catching on brace.  She then continued donning without A.  Socks applied for pt due to time constraints.  Pt resting in chair with all needs met.  Pt will need a regular BSC at discharge vs a wide drop arm.  Therapy Documentation Precautions:  Precautions Precautions: Fall Required Braces or Orthoses: Other Brace Other Brace: hinged knee brace on L LE  Restrictions Weight Bearing Restrictions: Yes LUE Weight Bearing: Weight bearing as tolerated LLE Weight Bearing: Non weight bearing Other Position/Activity Restrictions: hinged knee brace locked in ext at night can be unlocked during day for ROM  Pain: Pain Assessment Pain Scale: 0-10 Pain Score: 0-No pain   Therapy/Group: Individual Therapy  Bangs 10/18/2018, 12:18 PM

## 2018-10-18 NOTE — Progress Notes (Signed)
Occupational Therapy Session Note  Patient Details  Name: Angela Booth MRN: [id number] Date of Birth: 10-17-1965  Today's Date: 10/18/2018 OT Individual Time: 1030-1130 OT Individual Time Calculation (min): 60 min    Short Term Goals: Week 2:  OT Short Term Goal 1 (Week 2): STGs = LTGs  Skilled Therapeutic Interventions/Progress Updates:    Treatment session with focus on functional transfers and mobility in home environment.  Pt received upright in w/c reporting having shower this AM during previous OT session.  Discussed setup of daughter's bathroom to complete tub/shower transfer.  Pt propelled w/c to ADL tubroom to complete tub/shower transfer.  Setup bathroom to simulate home setup.  Pt ambulated with PFRW and CGA to BSC to simulate BSC over toilet.  Pt completed sit <> stand from [facility] with supervision.  Completed stand pivot transfer BSC > tub bench with PFRW with CGA.  Pt utilized leg lifter to advance LLE over tub ledge with increased time.  Discussed sequencing of transfers and bathing/dressing upon d/c with plan to undress from toilet if wearing underwear/shorts and then undress UB once on tub bench.  Pt propelled w/c through ADL apt to problem solve accessing various spaces in home setup.  Discussed strategies for elevating seat height on recliner as needed for sit > stand.  Pt propelled w/c in ADL kitchen to simulate obtaining items while educating on modifications to increase success.  Pt returned to room and left upright in w/c with all needs in reach.  Therapy Documentation Precautions:  Precautions Precautions: Fall Required Braces or Orthoses: Other Brace Other Brace: hinged knee brace on L LE  Restrictions Weight Bearing Restrictions: Yes LUE Weight Bearing: Weight bearing as tolerated LLE Weight Bearing: Non weight bearing Other Position/Activity Restrictions: hinged knee brace locked in ext at night can be unlocked during day for ROM Pain:  Pt with no c/o  pain   Therapy/Group: Individual Therapy  Rosalio Loud 10/18/2018, 1:59 PM

## 2018-10-18 NOTE — Progress Notes (Signed)
Social Work Patient ID: Angela Booth, female   DOB: [birth year], 53 y.o.   MRN: [id number] Met with pt to discuss discharge after family education tomorrow. She feels her family can get the home ready and this will work. Asked MD and team all on board for discharge tomorrow after education completed.

## 2018-10-18 NOTE — Progress Notes (Signed)
Recreational Therapy Discharge Summary Patient Details  Name: Angela Booth MRN: [id number] Date of Birth: 09-28-1965 Today's Date: 10/18/2018  Long term goals set: 1  Long term goals met: 1  Comments on progress toward goals: Pt has made good progress during LOS and is scheduled for discharge to daughter's home 4/25 with family providing 24 hour supervision/assistance.  Pt is set up assistance-mod I level for simple TR tasks.  Much of our therapy time focused on coping strategies, activity analysis with potential adaptations & energy conservation. Goal met. Reasons for discharge: discharge from hospital Patient/family agrees with progress made and goals achieved: Yes  [name] 10/18/2018, 8:09 AM

## 2018-10-18 NOTE — Progress Notes (Signed)
Physical Therapy Session Note  Patient Details  Name: Angela Booth MRN: [id number] Date of Birth: Oct 02, 1965  Today's Date: 10/18/2018 PT Individual Time: 1330-1445 PT Individual Time Calculation (min): 75 min   Short Term Goals:  Week 2:  PT Short Term Goal 1 (Week 2): = LTGs due to ELOS       Skilled Therapeutic Interventions/Progress Updates:   Pt resting in w/c.  Personal DME was delivered.  PT adjusted platforms on wide RW (needed for stability) to the inside, with good results.    Stand pivot to new RW with supervision.    Personal W/c ELRS adjusted for pt's body habitus. Vendor did not bring w/c cushion; Becky CSW notified.  Pt propelled x 100' with supervision before fatigue of LUE prevented further distance. Before standing up, pt manages R foot rest with extra time and 1 cue; max assist for L footrest.  Gait training with personal PFRW x 30' including turns, with close supervision.    Seated Therapeutic exercise performed with LE to increase strength for functional mobility: 10 x 1 each bil adductor squeezes, 20 x 1 R/L ankle pumps.  Toilet transfer with PFRW to [facility] over toilet, supervision.  Continent voiding.  Pt transferred back to bed.  Pt doffed knee brace with assistance.  At end of session, pt resting in bed with needs at hand and alarm set.     Therapy Documentation Precautions:  Precautions Precautions: Fall Required Braces or Orthoses: Other Brace Other Brace: hinged knee brace on L LE  Restrictions Weight Bearing Restrictions: Yes LUE Weight Bearing: Weight bearing as tolerated LLE Weight Bearing: Non weight bearing Other Position/Activity Restrictions: hinged knee brace locked in ext at night can be unlocked during day for ROM  Pain: "it's fine" unrated         Therapy/Group: Individual Therapy  Eleanora Guinyard 10/18/2018, 4:33 PM

## 2018-10-19 ENCOUNTER — Encounter (HOSPITAL_COMMUNITY): Payer: Self-pay | Admitting: Occupational Therapy

## 2018-10-19 ENCOUNTER — Ambulatory Visit (HOSPITAL_COMMUNITY): Payer: Self-pay

## 2018-10-19 LAB — CBC
HCT: 29.6 % — ABNORMAL LOW (ref 36.0–46.0)
Hemoglobin: 9.7 g/dL — ABNORMAL LOW (ref 12.0–15.0)
MCH: 30.2 pg (ref 26.0–34.0)
MCHC: 32.8 g/dL (ref 30.0–36.0)
MCV: 92.2 fL (ref 80.0–100.0)
Platelets: 360 10*3/uL (ref 150–400)
RBC: 3.21 MIL/uL — ABNORMAL LOW (ref 3.87–5.11)
RDW: 14.9 % (ref 11.5–15.5)
WBC: 6.7 10*3/uL (ref 4.0–10.5)
nRBC: 0 % (ref 0.0–0.2)

## 2018-10-19 LAB — BASIC METABOLIC PANEL
Anion gap: 10 (ref 5–15)
BUN: 10 mg/dL (ref 6–20)
CO2: 27 mmol/L (ref 22–32)
Calcium: 9.4 mg/dL (ref 8.9–10.3)
Chloride: 101 mmol/L (ref 98–111)
Creatinine, Ser: 0.71 mg/dL (ref 0.44–1.00)
GFR calc Af Amer: >60 mL/min (ref >60)
GFR calc non Af Amer: >60 mL/min (ref >60)
Glucose, Bld: 163 mg/dL — ABNORMAL HIGH (ref 70–99)
Potassium: 3.8 mmol/L (ref 3.5–5.1)
Sodium: 138 mmol/L (ref 135–145)

## 2018-10-19 LAB — GLUCOSE, CAPILLARY
Glucose-Capillary: 151 mg/dL — ABNORMAL HIGH (ref 70–99)
Glucose-Capillary: 153 mg/dL — ABNORMAL HIGH (ref 70–99)

## 2018-10-19 MED ORDER — ACETAMINOPHEN 325 MG PO TABS: 650.0000 mg | ORAL_TABLET | Freq: Four times a day (QID) | ORAL | Status: AC

## 2018-10-19 MED ORDER — INSULIN ASPART 100 UNIT/ML CARTRIDGE (PENFILL): SUBCUTANEOUS | 0 refills | Status: AC

## 2018-10-19 MED ORDER — POLYETHYLENE GLYCOL 3350 17 G PO PACK: 17.0000 g | PACK | Freq: Every day | ORAL | 0 refills | Status: AC | PRN

## 2018-10-19 MED ORDER — ENOXAPARIN SODIUM 40 MG/0.4ML ~~LOC~~ SOLN: 40.0000 mg | SUBCUTANEOUS | 1 refills | Status: AC

## 2018-10-19 MED ORDER — VITAMINS A & D EX OINT: TOPICAL_OINTMENT | Freq: Two times a day (BID) | CUTANEOUS | 0 refills | Status: AC

## 2018-10-19 MED ORDER — POLYSACCHARIDE IRON COMPLEX 150 MG PO CAPS: 150.0000 mg | ORAL_CAPSULE | Freq: Every day | ORAL | 1 refills | Status: AC

## 2018-10-19 MED ORDER — INSULIN PEN NEEDLE 32G X 6 MM MISC: 1.0000 "application " | Freq: Three times a day (TID) | 1 refills | Status: AC

## 2018-10-19 MED ORDER — TRAMADOL HCL 50 MG PO TABS: 50.0000 mg | ORAL_TABLET | Freq: Four times a day (QID) | ORAL | 0 refills | Status: DC | PRN

## 2018-10-19 MED ORDER — INSULIN DETEMIR 100 UNIT/ML ~~LOC~~ SOLN: 35.0000 [IU] | Freq: Two times a day (BID) | SUBCUTANEOUS | 11 refills | Status: AC

## 2018-10-19 MED ORDER — METHOCARBAMOL 500 MG PO TABS: 500.0000 mg | ORAL_TABLET | Freq: Four times a day (QID) | ORAL | 1 refills | Status: AC | PRN

## 2018-10-19 MED ORDER — TRAMADOL HCL 50 MG PO TABS: 50.0000 mg | ORAL_TABLET | Freq: Four times a day (QID) | ORAL | 0 refills | Status: AC | PRN

## 2018-10-19 MED ORDER — GABAPENTIN 100 MG PO CAPS: 100.0000 mg | ORAL_CAPSULE | Freq: Three times a day (TID) | ORAL | 1 refills | Status: AC

## 2018-10-19 NOTE — Progress Notes (Signed)
Occupational Therapy Session Note  Patient Details  Name: Angela Booth MRN: [id number] Date of Birth: [birth year]  Today's Date: 10/19/2018 OT Individual Time: 1300-1400 OT Individual Time Calculation (min): 60 min    Short Term Goals: Week 2:  OT Short Term Goal 1 (Week 2): STGs = LTGs  Skilled Therapeutic Interventions/Progress Updates:    Completed family education with pt, pt's husband, and pt's daughter.  Completed functional transfers in ADL apt at overall close supervision.  Educated on proper body mechanics if needed to physically assist and modifications to setup to increase independence.  Pt and family pleased with progress and ready for d/c.  Therapy Documentation Precautions:  Precautions Precautions: Fall Required Braces or Orthoses: Other Brace Other Brace: hinged knee brace on L LE Restrictions Weight Bearing Restrictions: Yes LUE Weight Bearing: Weight bearing as tolerated LLE Weight Bearing: Non weight bearing Other Position/Activity Restrictions: hinged knee brace can be removed at night; can be unlocked during day for ROM Pain: Pain Assessment Pain Scale: 0-10 Pain Score: 3  Pain Type: Acute pain Pain Location: Leg Pain Orientation: Left Pain Descriptors / Indicators: Aching;Throbbing Pain Intervention(s): Repositioned   Therapy/Group: Individual Therapy  Rosalio Loud 10/19/2018, 3:06 PM

## 2018-10-19 NOTE — Progress Notes (Signed)
Social Work  Discharge Note  The overall goal for the admission was met for:   Discharge location: Yes-HOME TO DAUGHTER'S HOME WITH HUSBAND DUE TO NO STAIRS-WILL HAVE 24 HR CARE  Length of Stay: Yes-14 DAYS  Discharge activity level: Yes-SUPERVISION-MIN ASSIST LEVEL  Home/community participation: Yes  Services provided included: MD, RD, PT, OT, RN, CM, Pharmacy, Neuropsych and SW  Financial Services: Other: PENDING MEDICAID-MED PAY  Follow-up services arranged: Home Health: [place], DME: [facility]-WHEELCHAIR, TUB BENCH, 3 IN 1 AND B-PLATFORM ROLLING WALKER and Patient/Family has no preference for HH/DME agencies  Comments (or additional information):HUSBAND AND DAUGHTER CAME IN FOR EDUCATION AND IT WENT WELL. ALL COMFORTABLE WITH CARE AND PT ABLE TO INJECT LOVENOX DUE TO TAKES INSULIN AT HOME. MATCH GIVEN FOR PRESCRIPTION ASSIST. PT'S MEDICAID PENDING  Patient/Family verbalized understanding of follow-up arrangements: Yes  Individual responsible for coordination of the follow-up plan: SELF & FRANK-HUSBAND  Confirmed correct DME delivered: Elease Hashimoto 10/19/2018    Elease Hashimoto

## 2018-10-19 NOTE — Discharge Instructions (Signed)
Inpatient Rehab Discharge Instructions  Angela Booth Discharge date and time:  10/19/18  Activities/Precautions/ Functional Status: Activity: no lifting, driving, or strenuous exercise till cleared by MD. No weight on Left leg. Lock brace when walking.  Diet: diabetic diet Wound Care: keep wound clean and dry. Contact Dr. Jena Gauss if you develop any problems with your incision/wound--redness, swelling, increase in pain, drainage or if you develop fever or chills. No weight on left leg.    Functional status:  ___ No restrictions     ___ Walk up steps independently ___ 24/7 supervision/assistance   ___ Walk up steps with assistance _X__ Intermittent supervision/assistance  ___ Bathe/dress independently _X__ Walk with walker    ___ Bathe/dress with assistance ___ Walk Independently    ___ Shower independently ___ Walk with assistance    _X__ Shower with assistance _X__ No alcohol     ___ Return to work/school ________  Special Instructions: 1. Monitor blood sugars before meals and at bedtime. Take a record with you to MD office.  Use following scale for three times a day with meals for blood sugars range: 100-150 range: Use 3 units 151-200 range: Use 6 units 201-250 range: Use 9 units 250-300 range: use 12 units  If your blood sugars are running over 200 consistently, I want you to call your provider office to get further instructions. 2. Keep Bledsoe brace on when out of bed. Keep incision clean and dry--no ointments, lotions or creams till cleared by MD.    COMMUNITY REFERRALS UPON DISCHARGE:    Home Health:   PT & OT  Agency:[facility] Phone:[phone]   Date of last service:10/19/2018  Medical Equipment/Items Ordered:WHEELCAHIR, WIDE ROLLING WALKER, 3 IN 1 AND TUB BENCH  Agency/Supplier:[facility]   [phone]  MATCH-GIVEN TO PATIENT FOR ASSIST WITH MEDICATION.  My questions have been answered and I understand these instructions. I will adhere to these goals and the  provided educational materials after my discharge from the hospital.  Patient/Caregiver Signature _______________________________ Date __________  Clinician Signature _______________________________________ Date __________  Please bring this form and your medication list with you to all your follow-up doctor's appointments.

## 2018-10-19 NOTE — Progress Notes (Signed)
Occupational Therapy Discharge Summary  Patient Details  Name: Angela Booth MRN: [id number] Date of Birth: [birth year]  Patient has met 8 of 8 long term goals due to improved balance, postural control and ability to compensate for deficits.  Patient to discharge at overall Min A to Supervision level.  Patient's care partner is independent to provide the necessary physical assistance at discharge.    Reasons goals not met: N/A  Recommendation:  Patient will benefit from ongoing skilled OT services in home health setting to continue to advance functional skills in the area of BADL and Reduce care partner burden.  Equipment: tub bench and 3 in 1  Reasons for discharge: treatment goals met and discharge from hospital  Patient/family agrees with progress made and goals achieved: Yes  OT Discharge Precautions/Restrictions  Precautions Precautions: Fall Required Braces or Orthoses: Other Brace Other Brace: hinged knee brace on L LE Restrictions Weight Bearing Restrictions: Yes LUE Weight Bearing: Weight bearing as tolerated LLE Weight Bearing: Non weight bearing Other Position/Activity Restrictions: hinged knee brace can be removed at night; can be unlocked during day for ROM Pain Pain Assessment Pain Scale: 0-10 Pain Score: 2  Pain Type: Acute pain Pain Location: Leg Pain Orientation: Left Pain Descriptors / Indicators: Discomfort ADL ADL Eating: Independent Grooming: Independent Upper Body Bathing: Setup Lower Body Bathing: Minimal assistance Upper Body Dressing: Setup Lower Body Dressing: Contact guard Toileting: Supervision/safety Toilet Transfer: Close supervision Toilet Transfer Method: Stand pivot Science writer: Engineer, technical sales Transfer: Minimal assistance Vision Baseline Vision/History: Wears glasses Wears Glasses: At all times Patient Visual Report: No change from baseline Vision Assessment?: No apparent visual deficits Perception   Perception: Within Functional Limits Praxis Praxis: Intact Cognition Overall Cognitive Status: Within Functional Limits for tasks assessed Arousal/Alertness: Awake/alert Orientation Level: Oriented X4 Memory: Appears intact Awareness: Appears intact Problem Solving: Appears intact Safety/Judgment: Appears intact Sensation Sensation Light Touch: Appears Intact Proprioception: Appears Intact Coordination Gross Motor Movements are Fluid and Coordinated: No Fine Motor Movements are Fluid and Coordinated: Yes Finger Nose Finger Test: [facility] Extremity/Trunk Assessment RUE Assessment RUE Assessment: Within Functional Limits LUE Assessment LUE Assessment: Within Functional Limits Active Range of Motion (AROM) Comments: much improved from eval, full ROM with reports of mild "tightness" in end range of shoulder flexion and horizontal adduction General Strength Comments: not formally tested as WBAT LUE Body System: Domingo Sep 10/19/2018, 1:19 PM

## 2018-10-19 NOTE — Progress Notes (Signed)
Patient did her own lovenox shot this morning. Left with family following therapies, no questions or concerns at this time.

## 2018-10-19 NOTE — Progress Notes (Signed)
Social Work Patient ID: Angela Booth, female   DOB: 1966/03/12, 53 y.o.   MRN: [id number] Family here to go through family education, equipment delivered and home health set up. Pt very happy to see daughter and husband. Pam-PA to go over discharge instructions once education completed and pt will be DC home.

## 2018-10-19 NOTE — Progress Notes (Signed)
Physical Therapy Discharge Summary  Patient Details  Name: Angela Booth MRN: [id number] Date of Birth: 1966/01/22  Today's Date: 10/19/2018 PT Individual Time: 1405-1505 PT Individual Time Calculation (min): 60 min    Patient has met 9 of 9 long term goals due to improved activity tolerance, improved balance, improved postural control, increased strength, increased range of motion, decreased pain and ability to compensate for deficits.  Patient to discharge at a wheelchair level Modified Independent.   Patient's care partner is independent to provide the necessary physical assistance at discharge.   Recommendation:  Patient will benefit from ongoing skilled PT services in home health setting to continue to advance safe functional mobility, address ongoing impairments in balance, strength, ROM, functional mobility, w/c mobility, activity tolerance, patient/family education, and minimize fall risk.  Equipment: B platform RW, 20x18 w/c with ELR  Reasons for discharge: treatment goals met  Patient/family agrees with progress made and goals achieved: Yes  Treatment Intervention Provided:  Patient in w/c upon PT arrival. Patient alert and agreeable to PT session. Patients daughter and husband present and participated in family education throughout session. Patient performed the mobility listed below as part of her discharge assessment with assistance provided by a family member with cues from therapist on correct guarding techniques. Patient and family demonstrated safe technique with all mobility throughout session. In addition, the patient performed a car transfers with supervision from her husband using B PFRW and went up and down a ramp in a w/c with min A from PT during first trial and from her husband during the second trial. Patient and family educated using teach back method for HEP, brace placement/positioning, fall safety in the home, emergency planning, and technique for backing the  w/c into the house over the small threshold.  Patient in w/c at end of session with breaks locked, family in the room, and all needs within reach. RN and PA made aware that patient completed therapies for d/c at end of session.  PT Discharge Precautions/Restrictions Precautions Precautions: Fall Required Braces or Orthoses: Other Brace Other Brace: hinged knee brace on L LE Restrictions Weight Bearing Restrictions: Yes LUE Weight Bearing: Weight bearing as tolerated LLE Weight Bearing: Non weight bearing Other Position/Activity Restrictions: hinged knee brace can be removed at night; can be unlocked during day for ROM Pain Pain Assessment Pain Scale: 0-10 Pain Score: 3  Pain Location: Leg Pain Orientation: Left Pain Descriptors / Indicators: Aching;Throbbing Pain Intervention(s): Repositioned Vision/Perception  Perception Perception: Within Functional Limits Praxis Praxis: Intact  Cognition Overall Cognitive Status: Within Functional Limits for tasks assessed Arousal/Alertness: Awake/alert Orientation Level: Oriented X4 Memory: Appears intact Awareness: Appears intact Problem Solving: Appears intact Safety/Judgment: Appears intact Sensation Sensation Light Touch: Appears Intact Coordination Gross Motor Movements are Fluid and Coordinated: No Fine Motor Movements are Fluid and Coordinated: Yes Coordination and Movement Description: limited by weakness, edema, and pain in L UE Motor  Motor Motor: Other (comment) Motor - Discharge Observations: limited by L LE NWB, weakness, and limited ROM  Mobility Bed Mobility Bed Mobility: Supine to Sit;Rolling Right;Rolling Left;Sit to Supine Rolling Right: Independent Rolling Left: Independent Supine to Sit: Independent Sit to Supine: Independent with assistive device Transfers Transfers: Sit to Stand;Stand to Sit;Stand Pivot Transfers Sit to Stand: Independent with assistive device Stand to Sit: Independent with assistive  device Stand Pivot Transfers: Independent with assistive device Transfer (Assistive device): Rolling walker Locomotion  Gait Ambulation: Yes Gait Assistance: Supervision/Verbal cueing Gait Distance (Feet): 12 Feet Assistive device: Rolling walker  Gait Assistance Details: Supervision for safety due to decreased balance and endurance Gait Gait: Yes Gait Pattern: Impaired Gait Pattern: Step-to pattern;Decreased step length - right;Decreased trunk rotation;Poor foot clearance - right;Lateral trunk lean to right;Right foot flat;Left hip hike(hop-to on R) Gait velocity: decreased Stairs / Additional Locomotion Stairs: No Ramp: Minimal Assistance - Patient >75%(in w/c) Wheelchair Mobility Wheelchair Mobility: Yes Wheelchair Assistance: Set up Lexicographer: Both upper extremities Wheelchair Parts Management: Needs assistance Distance: 82'  Trunk/Postural Assessment  Cervical Assessment Cervical Assessment: Within Functional Limits Thoracic Assessment Thoracic Assessment: Within Functional Limits Lumbar Assessment Lumbar Assessment: Within Functional Limits Postural Control Postural Control: Within Functional Limits  Balance Balance Balance Assessed: Yes Static Sitting Balance Static Sitting - Level of Assistance: 7: Independent Dynamic Sitting Balance Dynamic Sitting - Level of Assistance: 6: Modified independent (Device/Increase time)(with reacher) Static Standing Balance Static Standing - Level of Assistance: 6: Modified independent (Device/Increase time)(with RW) Extremity Assessment  RUE Assessment RUE Assessment: Within Functional Limits LUE Assessment LUE Assessment: Exceptions to [facility] Active Range of Motion (AROM) Comments: WFL for all functional mobility General Strength Comments: not formally tested as WBAT LUE Body System: Ortho RLE Assessment RLE Assessment: Within Functional Limits Active Range of Motion (AROM) Comments: WFL for all functional  mobility General Strength Comments: Grossly in sitting 5/5 throughout LLE Assessment LLE Assessment: Exceptions to [facility] Active Range of Motion (AROM) Comments: limited to 45 degrees of knee flexion and full extension General Strength Comments: Not formally tested due to NWB precautions, grossly at least 3-/5 throughout in available range.    Cregg Jutte L Claire Dolores 10/19/2018, 5:38 PM

## 2018-10-22 ENCOUNTER — Encounter: Payer: Self-pay | Admitting: Registered Nurse

## 2018-10-22 ENCOUNTER — Encounter: Payer: Self-pay | Attending: Registered Nurse | Admitting: Registered Nurse

## 2018-10-22 ENCOUNTER — Other Ambulatory Visit: Payer: Self-pay

## 2018-10-22 ENCOUNTER — Telehealth: Payer: Self-pay

## 2018-10-22 DIAGNOSIS — T1490XA Injury, unspecified, initial encounter: Secondary | ICD-10-CM

## 2018-10-22 DIAGNOSIS — S82122A Displaced fracture of lateral condyle of left tibia, initial encounter for closed fracture: Secondary | ICD-10-CM

## 2018-10-22 DIAGNOSIS — E669 Obesity, unspecified: Secondary | ICD-10-CM

## 2018-10-22 DIAGNOSIS — G8918 Other acute postprocedural pain: Secondary | ICD-10-CM

## 2018-10-22 DIAGNOSIS — E1169 Type 2 diabetes mellitus with other specified complication: Secondary | ICD-10-CM

## 2018-10-22 DIAGNOSIS — S82142A Displaced bicondylar fracture of left tibia, initial encounter for closed fracture: Secondary | ICD-10-CM

## 2018-10-22 NOTE — Progress Notes (Signed)
Transitional Care call  Patient name: Angela Booth  DOB: 08-26-1965  1. Are you/is patient experiencing any problems since coming home? No a. Are there any questions regarding any aspect of care? No 2. Are there any questions regarding medications administration/dosing? No a. Are meds being taken as prescribed? Yes b. "Patient should review meds with caller to confirm" Medication List Reviewed 3. Have there been any falls? No 4. Has Home Health been to the house and/or have they contacted you? Yes, [facility]  a. If not, have you tried to contact them? NA b. Can we help you contact them? NA 5. Are bowels and bladder emptying properly? Yes a. Are there any unexpected incontinence issues? No b. If applicable, is patient following bowel/bladder programs? NA 6. Any fevers, problems with breathing, unexpected pain? No 7. Are there any skin problems or new areas of breakdown? No 8. Has the patient/family member arranged specialty MD follow up (ie cardiology/neurology/renal/surgical/etc.)?  HF[facility] appointments are scheduled.  a. Can we help arrange? NA 9. Does the patient need any other services or support that we can help arrange? No 10. Are caregivers following through as expected in assisting the patient? Yes, she is staying with her daughter she reports.  11. Has the patient quit smoking, drinking alcohol, or using drugs as recommended? [name] states she doesn't smoke, drink alcohol or use illicit drugs.   Total Time Spent on Call: 10 minutes  Appointment date/time 11/02/2018  For  Webex appointment on 11/02/2018 at 11:00. With Dr. Allena Katz. At [address] [facility] [address]

## 2018-10-22 NOTE — Telephone Encounter (Signed)
Patent called back, stated was on the phone with someone here at the office and is not able to open the app on her macbook or her ipad for her Webex visit on 11-02-2018.  York Spaniel it was a follow up appointment from the hospital.  Noted in patients chart that Riley Lam had just preformed a TC call with patient.

## 2018-10-22 NOTE — Telephone Encounter (Signed)
Return Angela Booth call,  April sent her a new e-mail, she was instructed to download Webex prior to her visit with Dr. Allena Katz, she verbalizes understanding.

## 2018-11-02 ENCOUNTER — Encounter: Payer: Self-pay | Admitting: Physical Medicine & Rehabilitation

## 2018-11-02 ENCOUNTER — Encounter: Payer: Self-pay | Attending: Physical Medicine & Rehabilitation | Admitting: Physical Medicine & Rehabilitation

## 2018-11-02 ENCOUNTER — Other Ambulatory Visit: Payer: Self-pay

## 2018-11-02 VITALS — Ht 63.0 in | Wt 220.0 lb

## 2018-11-02 DIAGNOSIS — T1490XA Injury, unspecified, initial encounter: Secondary | ICD-10-CM

## 2018-11-02 DIAGNOSIS — E1169 Type 2 diabetes mellitus with other specified complication: Secondary | ICD-10-CM

## 2018-11-02 DIAGNOSIS — G8918 Other acute postprocedural pain: Secondary | ICD-10-CM

## 2018-11-02 DIAGNOSIS — E669 Obesity, unspecified: Secondary | ICD-10-CM

## 2018-11-02 DIAGNOSIS — S82122A Displaced fracture of lateral condyle of left tibia, initial encounter for closed fracture: Secondary | ICD-10-CM

## 2018-11-02 DIAGNOSIS — S82142A Displaced bicondylar fracture of left tibia, initial encounter for closed fracture: Secondary | ICD-10-CM

## 2018-11-02 NOTE — Progress Notes (Signed)
Subjective:    Patient ID: Angela Booth, female    DOB: 03/21/1966, 53 y.o.   MRN: [id number]  TELEHEALTH NOTE  Due to national recommendations of social distancing due to COVID 19, an audio/video telehealth visit is felt to be most appropriate for this patient at this time.  See Chart message from today for the patient's consent to telehealth from [facility].     I verified that I am speaking with the correct person using two identifiers.  Location of patient: Home Location of provider: Office Method of communication: WebEx Names of participants : Wadie LessenLisa Kellner scheduling, Barbee ShropshireBruce Bright obtaining consent and vitals if available Established patient Time spent on call: 13 minutes  HPI Female with history of HTN, T2DM, anxiety disorder presents for follow-up for multi--Ortho.  At discharge, she was instructed to cont NWB LLE and wearing brace when OOB. CBGs have been controlled.  She followed up with PCP. She sees Ortho in 2 weeks. Pain is improving.  Therapies: 2/week last week, but no one came out this week DME: Bedside commode, shower chair Mobility: Walker for transfers, wheelchair most of the time  Pain Inventory Average Pain 3 Pain Right Now 3 My pain is intermittent, sharp and aching  In the last 24 hours, has pain interfered with the following? General activity 3 Relation with others 3 Enjoyment of life 3 What TIME of day is your pain at its worst? evening Sleep (in general) Good  Pain is worse with: walking, standing and some activites Pain improves with: therapy/exercise, medication and elevation Relief from Meds: 8  Mobility walk with assistance use a walker ability to climb steps?  no do you drive?  no use a wheelchair  Function not employed: date last employed .  Neuro/Psych trouble walking  Prior Studies Any changes since last visit?  no  Physicians involved in your care Any changes since last visit?  no    Family History  Problem Relation Age of Onset  . Heart disease Father    Social History   Socioeconomic History  . Marital status: Married    Spouse name: Catarina HartshornFrank Ihrig  . Number of children: 2  . Years of education: 6612  . Highest education level: High school graduate  Occupational History  . Occupation: housewife  Social Needs  . Financial resource strain: Somewhat hard  . Food insecurity:    Worry: Never true    Inability: Never true  . Transportation needs:    Medical: No    Non-medical: No  Tobacco Use  . Smoking status: Never Smoker  . Smokeless tobacco: Never Used  Substance and Sexual Activity  . Alcohol use: Not Currently  . Drug use: Never  . Sexual activity: Yes  Lifestyle  . Physical activity:    Days per week: 3 days    Minutes per session: 30 min  . Stress: Not at all  Relationships  . Social connections:    Talks on phone: Not on file    Gets together: Not on file    Attends religious service: Not on file    Active member of club or organization: Not on file    Attends meetings of clubs or organizations: Not on file    Relationship status: Not on file  Other Topics Concern  . Not on file  Social History Narrative   ** Merged History Encounter **       Past Surgical History:  Procedure Laterality Date  .  ABDOMINAL HYSTERECTOMY    . 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 BREAST BIOPSY Right   . CESAREAN SECTION     x2  . DILATION AND CURETTAGE OF UTERUS    . ORIF HUMERUS FRACTURE Left 10/03/2018   Procedure: OPEN REDUCTION INTERNAL FIXATION (ORIF) HUMERAL SHAFT FRACTURE;  Surgeon: Roby Lofts, MD;  Location: [facility] OR;  Service: Orthopedics;  Laterality: Left;  . ORIF TIBIA PLATEAU Left 10/03/2018   Procedure: OPEN REDUCTION INTERNAL FIXATION (ORIF) TIBIAL PLATEAU;  Surgeon: Roby Lofts, MD;  Location: [facility] OR;  Service: Orthopedics;  Laterality: Left;   Past Medical History:  Diagnosis Date  . Anxiety   . Chest pain of uncertain  etiology    work up  pending/2020  . Diabetes mellitus without complication (HCC)   . Hypertension   . Mild persistent asthma   . Thyroid disease    Ht 5\' 3"  (1.6 m)   Wt 220 lb (99.8 kg)   BMI 38.97 kg/m   Opioid Risk Score:   Fall Risk Score:  `1  Depression screen PHQ 2/9  No flowsheet data found.   Review of Systems  Constitutional: Negative.   HENT: Negative.   Eyes: Negative.   Respiratory: Negative.   Cardiovascular: Negative.   Gastrointestinal: Negative.   Endocrine: Negative.   Genitourinary: Negative.   Musculoskeletal: Positive for arthralgias and gait problem.  Skin: Negative.   Allergic/Immunologic: Negative.   Hematological: Negative.   Psychiatric/Behavioral: Negative.   All other systems reviewed and are negative.      Objective:   Physical Exam Gen: NAD. HENT: Normocephalic, Atraumatic Eyes: EOMI. No discharge.  Cardio: No JVD. Pulm: Effort normal Neuro: Alert and oriented    Assessment & Plan:  Female with history of HTN, T2DM, anxiety disorder presents for follow-up for multi--Ortho.  1.  Deficits with mobility, transfers, endurance, self-care secondary to multi-Ortho.             Cont therapies, encouraged follow up due to lack of visits this week  Follow up with Ortho  2. Anticoagulation  Cont Lovenox  3. Pain Management:   Cont to weak Tramadol  Cont OTC meds  4. T2DM   Cont meds  Improving control per patient  Cont follow up with PCP  Meds reviewed Referrals reviewed All questions answered

## 2019-01-11 ENCOUNTER — Ambulatory Visit: Payer: Self-pay | Admitting: Physical Medicine & Rehabilitation

## 2019-01-25 ENCOUNTER — Ambulatory Visit: Payer: Self-pay | Admitting: Physical Medicine & Rehabilitation

## 2019-04-05 ENCOUNTER — Ambulatory Visit: Payer: Self-pay | Admitting: Physical Medicine & Rehabilitation

## 2019-12-03 ENCOUNTER — Other Ambulatory Visit: Payer: Self-pay

## 2019-12-03 ENCOUNTER — Ambulatory Visit
Admission: RE | Admit: 2019-12-03 | Discharge: 2019-12-03 | Disposition: A | Discharge status: Home / Self Care | Payer: MEDICAID | Source: Ambulatory Visit | Attending: Oncology | Admitting: Oncology

## 2019-12-03 ENCOUNTER — Ambulatory Visit: Payer: Self-pay | Attending: Oncology | Admitting: *Deleted

## 2019-12-03 VITALS — BP 140/86 | HR 77 | Temp 98.2°F | Ht 63.0 in | Wt 238.4 lb

## 2019-12-03 DIAGNOSIS — Z0000 Encounter for general adult medical examination without abnormal findings: Secondary | ICD-10-CM | POA: Insufficient documentation

## 2019-12-03 NOTE — Patient Instructions (Signed)
Gave patient hand-out, Women Staying Healthy, Active and Well from [facility], with education on breast health, pap smears, heart and colon health. 
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
## 2019-12-05 ENCOUNTER — Encounter: Payer: Self-pay | Admitting: *Deleted

## 2019-12-05 NOTE — Progress Notes (Signed)
Letter mailed from [facility] to inform patient of her normal mammogram results.  Patient is to follow-up with annual screening in one year. 
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
## 2020-10-05 ENCOUNTER — Other Ambulatory Visit: Payer: Self-pay

## 2020-10-05 ENCOUNTER — Ambulatory Visit
Admission: EM | Admit: 2020-10-05 | Discharge: 2020-10-05 | Disposition: A | Discharge status: Home / Self Care | Payer: Self-pay

## 2020-10-05 DIAGNOSIS — H1031 Unspecified acute conjunctivitis, right eye: Secondary | ICD-10-CM

## 2020-10-05 DIAGNOSIS — I10 Essential (primary) hypertension: Secondary | ICD-10-CM

## 2020-10-05 MED ORDER — POLYMYXIN B-TRIMETHOPRIM 10000-0.1 UNIT/ML-% OP SOLN: 1.0000 [drp] | Freq: Four times a day (QID) | OPHTHALMIC | 0 refills | Status: AC

## 2020-10-05 NOTE — Discharge Instructions (Addendum)
Use the antibiotic eyedrops as prescribed.    Follow-up with your eye doctor for a recheck in 1 to 2 days if your symptoms are not improving.    Go to the emergency department if you have acute eye pain, changes in your vision, or other concerning symptoms.    Your blood pressure is elevated today at 161/92.  Please have this rechecked by your primary care provider in 2-4 weeks.

## 2020-10-05 NOTE — ED Provider Notes (Signed)
Renaldo Fiddler    CSN: [id number] Arrival date & time: 10/05/20  6568      History   Chief Complaint Chief Complaint  Patient presents with  . Eye Problem    HPI Angela Booth is a 55 y.o. female.   Patient presents with 1 day history of right eye redness, itchiness, irritation, tearing.  No known injury.  She denies acute eye pain or changes in her vision.  She denies fever, chills, runny nose, congestion, cough, chest pain, shortness of breath, numbness, weakness, or other symptoms.  Her medical history includes morbid obesity, poorly controlled diabetes, thyroid disease, asthma, chest pain of uncertain etiology, hypertension, anxiety.  The history is provided by the patient and medical records.    Past Medical History:  Diagnosis Date  . Anxiety   . Chest pain of uncertain etiology    work up  pending/2020  . Diabetes mellitus without complication (HCC)   . Hypertension   . Mild persistent asthma   . Thyroid disease     Patient Active Problem List   Diagnosis Date Noted  . Closed bicondylar fracture of left tibial plateau   . Labile blood glucose   . Diabetes mellitus type 2 in obese (HCC)   . Postoperative pain   . Trauma 10/05/2018  . Fracture   . Post-operative pain   . Acute blood loss anemia   . Poorly controlled diabetes mellitus (HCC)   . Morbid obesity (HCC)   . Multiple trauma   . Displaced fracture of left humerus 10/04/2018  . Peripheral tear of lateral meniscus of left knee 10/04/2018  . Other spontaneous disruption of medial collateral ligament of left knee 10/04/2018  . Closed fracture of lateral portion of left tibial plateau 10/02/2018    Past Surgical History:  Procedure Laterality Date  . ABDOMINAL HYSTERECTOMY    . BREAST BIOPSY Right 03/29/2017   stereo bx- PASH  . BREAST BIOPSY Right   . CESAREAN SECTION     x2  . DILATION AND CURETTAGE OF UTERUS    . ORIF HUMERUS FRACTURE Left 10/03/2018   Procedure: OPEN REDUCTION INTERNAL  FIXATION (ORIF) HUMERAL SHAFT FRACTURE;  Surgeon: Roby Lofts, MD;  Location: [facility] OR;  Service: Orthopedics;  Laterality: Left;  . ORIF TIBIA PLATEAU Left 10/03/2018   Procedure: OPEN REDUCTION INTERNAL FIXATION (ORIF) TIBIAL PLATEAU;  Surgeon: Roby Lofts, MD;  Location: [facility] OR;  Service: Orthopedics;  Laterality: Left;    OB History   No obstetric history on file.      Home Medications    Prior to Admission medications   Medication Sig Start Date End Date Taking? Authorizing Provider  trimethoprim-polymyxin b (POLYTRIM) ophthalmic solution Place 1 drop into both eyes 4 (four) times daily for 7 days. 10/05/20 10/12/20 Yes Mickie Bail, NP  acetaminophen (TYLENOL) 325 MG tablet Take 2 tablets (650 mg total) by mouth every 6 (six) hours. 10/19/18   Love, Evlyn Kanner, PA-C  enalapril (VASOTEC) 20 MG tablet Take 20 mg by mouth daily. for high blood pressure 08/11/20   [provider]  enoxaparin (LOVENOX) 40 MG/0.4ML injection Inject 0.4 mLs (40 mg total) into the skin daily. 10/19/18   Love, Evlyn Kanner, PA-C  gabapentin (NEURONTIN) 100 MG capsule Take 1 capsule (100 mg total) by mouth 3 (three) times daily. 10/19/18   Love, Evlyn Kanner, PA-C  insulin aspart (NOVOLOG PENFILL) cartridge Use 3-15 units tid with meals per SSI 10/19/18   Love, Evlyn Kanner,  PA-C  insulin detemir (LEVEMIR) 100 UNIT/ML injection Inject 0.35 mLs (35 Units total) into the skin 2 (two) times daily. 10/19/18   Love, Evlyn KannerPamela S, PA-C  Insulin Pen Needle (COMFORT EZ PEN NEEDLES) 32G X 6 MM MISC 1 application by Does not apply route 3 (three) times daily before meals. 10/19/18   Love, Evlyn KannerPamela S, PA-C  iron polysaccharides (NIFEREX) 150 MG capsule Take 1 capsule (150 mg total) by mouth daily at 12 noon. 10/19/18   Love, Evlyn KannerPamela S, PA-C  methocarbamol (ROBAXIN) 500 MG tablet Take 1 tablet (500 mg total) by mouth every 6 (six) hours as needed for muscle spasms. 10/19/18   Love, Evlyn KannerPamela S, PA-C  montelukast (SINGULAIR) 10 MG tablet Take 10  mg by mouth at bedtime.    [provider]  polyethylene glycol (MIRALAX / GLYCOLAX) 17 g packet Take 17 g by mouth daily as needed. 10/19/18   LoveEvlyn Kanner, Pamela S, PA-C  PROAIR HFA 108 [phone](90 Base) MCG/ACT inhaler 2 puff  every four to six hours as needed 05/04/20   [provider]  thyroid (ARMOUR) 120 MG tablet Take 120 mg by mouth daily before breakfast.    [provider]  traMADol (ULTRAM) 50 MG tablet Take 1 tablet (50 mg total) by mouth every 6 (six) hours as needed for moderate pain or severe pain. 10/19/18   Love, Evlyn KannerPamela S, PA-C  Vitamins A & D (VITAMIN A & D) ointment Apply topically 2 (two) times daily. 10/19/18   Jacquelynn CreeLove, Pamela S, PA-C    Family History Family History  Problem Relation Age of Onset  . Lung cancer Father   . Heart disease Father   . Kidney failure Mother   . Heart disease Mother   . Lung disease Mother   . Breast cancer Neg Hx     Social History Social History   Tobacco Use  . Smoking status: Never Smoker  . Smokeless tobacco: Never Used  Substance Use Topics  . Alcohol use: Not Currently  . Drug use: Never     Allergies   Crestor [rosuvastatin calcium], Hydrocodone, Shellfish allergy, Hydrocodone, Oxycodone, Rosuvastatin, Shellfish allergy, and Simvastatin   Review of Systems Review of Systems  Constitutional: Negative for chills and fever.  HENT: Negative for ear pain and sore throat.   Eyes: Positive for photophobia, discharge, redness and itching. Negative for pain and visual disturbance.  Respiratory: Negative for cough and shortness of breath.   Cardiovascular: Negative for chest pain and palpitations.  Gastrointestinal: Negative for abdominal pain and vomiting.  Genitourinary: Negative for dysuria and hematuria.  Musculoskeletal: Negative for arthralgias and back pain.  Skin: Negative for color change and rash.  Neurological: Negative for seizures and syncope.  All other systems reviewed and are negative.    Physical  Exam Triage Vital Signs ED Triage Vitals [10/05/20 0905]  Enc Vitals Group     BP (!) 161/92     Pulse Rate 81     Resp 18     Temp 98.6 F (37 C)     Temp Source Oral     SpO2 96 %     Weight      Height      Head Circumference      Peak Flow      Pain Score      Pain Loc      Pain Edu?      Excl. in GC?    No data found.  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  Constitutional:      General: She is not in acute distress.    Appearance: She is well-developed. She is obese.  HENT:     Head: Normocephalic and atraumatic.     Mouth/Throat:     Mouth: Mucous membranes are moist.  Eyes:     General: Lids are normal.     Extraocular Movements: Extraocular movements intact.     Conjunctiva/sclera:     Right eye: Right conjunctiva is injected.     Pupils: Pupils are equal, round, and reactive to light.  Cardiovascular:     Rate and Rhythm: Normal rate and regular rhythm.     Heart sounds: Normal heart sounds.  Pulmonary:     Effort: Pulmonary effort is normal. No respiratory distress.     Breath sounds: Normal breath sounds.  Abdominal:     Palpations: Abdomen is soft.     Tenderness: There is no abdominal tenderness.  Musculoskeletal:     Cervical back: Neck supple.  Skin:    General: Skin is warm and dry.  Neurological:     General: No focal deficit present.     Mental Status: She is alert and oriented to person, place, and time.     Gait: Gait normal.  Psychiatric:        Mood and Affect: Mood normal.        Behavior: Behavior normal.      UC Treatments / Results  Labs (all labs ordered are listed,  but only abnormal results are displayed) Labs Reviewed - No data to display  EKG   Radiology No results found.  Procedures Procedures (including critical care time)  Medications Ordered in UC Medications - No data to display  Initial Impression / Assessment and Plan / UC Course  I have reviewed the triage vital signs and the nursing notes.  Pertinent labs & imaging results that were available during my care of the patient were reviewed by me and considered in my medical decision making (see chart for details).   Acute bacterial conjunctivitis of right eye.  Elevated blood pressure with known hypertension.  Treating eye infection with Polytrim eyedrops.  Instructed patient to follow-up with her eye care provider in 1 to 2 days for symptoms or not improving.  ED precautions discussed if she has acute eye pain, changes in her vision, or other concerning symptoms.  Discussed with patient that her blood pressure is elevated today needs to be rechecked by her PCP in 2 to 4 weeks.  Education provided on managing hypertension.  Patient agrees to plan of care.   Final Clinical Impressions(s) / UC Diagnoses   Final diagnoses:  Acute bacterial conjunctivitis of right eye  Elevated blood pressure reading in office with diagnosis of hypertension     Discharge Instructions     Use the antibiotic eyedrops as prescribed.    Follow-up with your eye doctor for a recheck in 1 to 2 days if your symptoms are not improving.  Go to the emergency department if you have acute eye pain, changes in your vision, or other concerning symptoms.    Your blood pressure is elevated today at 161/92.  Please have this rechecked by your primary care provider in 2-4 weeks.         ED Prescriptions    Medication Sig Dispense Auth. Provider   trimethoprim-polymyxin b (POLYTRIM) ophthalmic solution Place 1 drop into both eyes 4 (four) times daily for 7 days. 10 mL Mickie Bail, NP     PDMP not reviewed  this encounter.   Mickie Bail, NP 10/05/20 [phone]

## 2020-10-05 NOTE — ED Triage Notes (Signed)
Patient presents to Urgent Care with complaints of right eye irritation, redness, and pain. She has a hx of allergies so unsure if pink eye or related to allergies. Treating her symptoms with her allergy meds.   Denies changes in vision, fever.
